# Patient Record
Sex: Female | Born: 1975 | Race: Black or African American | Hispanic: No | Marital: Single | Smoking: Current every day smoker
Health system: Southern US, Community
[De-identification: ages and names within clinical notes are randomized; demographics above are authoritative.]

## PROBLEM LIST (undated history)

## (undated) DIAGNOSIS — D259 Leiomyoma of uterus, unspecified: Secondary | ICD-10-CM

## (undated) DIAGNOSIS — S82843A Displaced bimalleolar fracture of unspecified lower leg, initial encounter for closed fracture: Secondary | ICD-10-CM

## (undated) DIAGNOSIS — F17200 Nicotine dependence, unspecified, uncomplicated: Secondary | ICD-10-CM

## (undated) HISTORY — PX: NO PAST SURGERIES: SHX2092

## (undated) HISTORY — DX: Leiomyoma of uterus, unspecified: D25.9

---

## 1998-07-19 ENCOUNTER — Emergency Department (HOSPITAL_COMMUNITY): Admission: EM | Admit: 1998-07-19 | Discharge: 1998-07-19 | Payer: Self-pay | Admitting: Emergency Medicine

## 1998-09-30 ENCOUNTER — Emergency Department (HOSPITAL_COMMUNITY): Admission: EM | Admit: 1998-09-30 | Discharge: 1998-09-30 | Payer: Self-pay | Admitting: Emergency Medicine

## 1999-03-25 ENCOUNTER — Emergency Department (HOSPITAL_COMMUNITY): Admission: EM | Admit: 1999-03-25 | Discharge: 1999-03-25 | Payer: Self-pay | Admitting: Emergency Medicine

## 1999-06-08 ENCOUNTER — Emergency Department (HOSPITAL_COMMUNITY): Admission: EM | Admit: 1999-06-08 | Discharge: 1999-06-08 | Payer: Self-pay | Admitting: Emergency Medicine

## 2000-04-26 ENCOUNTER — Ambulatory Visit (HOSPITAL_COMMUNITY): Admission: RE | Admit: 2000-04-26 | Discharge: 2000-04-26 | Payer: Self-pay | Admitting: *Deleted

## 2000-05-17 ENCOUNTER — Ambulatory Visit (HOSPITAL_COMMUNITY): Admission: RE | Admit: 2000-05-17 | Discharge: 2000-05-17 | Payer: Self-pay | Admitting: *Deleted

## 2000-06-01 ENCOUNTER — Encounter: Payer: Self-pay | Admitting: *Deleted

## 2000-06-01 ENCOUNTER — Encounter (INDEPENDENT_AMBULATORY_CARE_PROVIDER_SITE_OTHER): Payer: Self-pay | Admitting: Specialist

## 2000-06-01 ENCOUNTER — Inpatient Hospital Stay (HOSPITAL_COMMUNITY): Admission: AD | Admit: 2000-06-01 | Discharge: 2000-06-15 | Payer: Self-pay | Admitting: *Deleted

## 2000-06-07 ENCOUNTER — Encounter: Payer: Self-pay | Admitting: Obstetrics & Gynecology

## 2000-12-13 ENCOUNTER — Emergency Department (HOSPITAL_COMMUNITY): Admission: EM | Admit: 2000-12-13 | Discharge: 2000-12-13 | Payer: Self-pay | Admitting: Emergency Medicine

## 2001-09-26 ENCOUNTER — Inpatient Hospital Stay (HOSPITAL_COMMUNITY): Admission: AD | Admit: 2001-09-26 | Discharge: 2001-09-26 | Payer: Self-pay | Admitting: *Deleted

## 2002-11-25 ENCOUNTER — Emergency Department (HOSPITAL_COMMUNITY): Admission: EM | Admit: 2002-11-25 | Discharge: 2002-11-25 | Payer: Self-pay | Admitting: Emergency Medicine

## 2003-08-23 ENCOUNTER — Emergency Department (HOSPITAL_COMMUNITY): Admission: EM | Admit: 2003-08-23 | Discharge: 2003-08-23 | Payer: Self-pay

## 2004-10-19 ENCOUNTER — Emergency Department (HOSPITAL_COMMUNITY): Admission: EM | Admit: 2004-10-19 | Discharge: 2004-10-19 | Payer: Self-pay | Admitting: Emergency Medicine

## 2005-08-30 ENCOUNTER — Emergency Department (HOSPITAL_COMMUNITY): Admission: EM | Admit: 2005-08-30 | Discharge: 2005-08-30 | Payer: Self-pay | Admitting: Emergency Medicine

## 2005-11-03 ENCOUNTER — Emergency Department (HOSPITAL_COMMUNITY): Admission: EM | Admit: 2005-11-03 | Discharge: 2005-11-03 | Payer: Self-pay | Admitting: Emergency Medicine

## 2005-11-05 ENCOUNTER — Emergency Department (HOSPITAL_COMMUNITY): Admission: EM | Admit: 2005-11-05 | Discharge: 2005-11-05 | Payer: Self-pay | Admitting: Emergency Medicine

## 2005-11-08 ENCOUNTER — Emergency Department (HOSPITAL_COMMUNITY): Admission: EM | Admit: 2005-11-08 | Discharge: 2005-11-08 | Payer: Self-pay | Admitting: Family Medicine

## 2006-12-27 ENCOUNTER — Emergency Department (HOSPITAL_COMMUNITY): Admission: EM | Admit: 2006-12-27 | Discharge: 2006-12-27 | Payer: Self-pay | Admitting: Family Medicine

## 2008-01-29 ENCOUNTER — Emergency Department (HOSPITAL_COMMUNITY): Admission: EM | Admit: 2008-01-29 | Discharge: 2008-01-29 | Payer: Self-pay | Admitting: Emergency Medicine

## 2008-06-12 ENCOUNTER — Emergency Department (HOSPITAL_COMMUNITY): Admission: EM | Admit: 2008-06-12 | Discharge: 2008-06-12 | Payer: Self-pay | Admitting: Emergency Medicine

## 2008-11-17 ENCOUNTER — Emergency Department (HOSPITAL_COMMUNITY): Admission: EM | Admit: 2008-11-17 | Discharge: 2008-11-17 | Payer: Self-pay | Admitting: Emergency Medicine

## 2008-11-20 ENCOUNTER — Emergency Department (HOSPITAL_COMMUNITY): Admission: EM | Admit: 2008-11-20 | Discharge: 2008-11-20 | Payer: Self-pay | Admitting: Family Medicine

## 2009-02-24 ENCOUNTER — Emergency Department (HOSPITAL_COMMUNITY): Admission: EM | Admit: 2009-02-24 | Discharge: 2009-02-24 | Payer: Self-pay | Admitting: Family Medicine

## 2009-07-15 ENCOUNTER — Emergency Department (HOSPITAL_COMMUNITY): Admission: EM | Admit: 2009-07-15 | Discharge: 2009-07-15 | Payer: Self-pay | Admitting: Family Medicine

## 2010-02-17 ENCOUNTER — Emergency Department (HOSPITAL_COMMUNITY): Admission: EM | Admit: 2010-02-17 | Discharge: 2010-02-17 | Payer: Self-pay | Admitting: Emergency Medicine

## 2011-01-26 LAB — CULTURE, ROUTINE-ABSCESS

## 2011-03-05 NOTE — Discharge Summary (Signed)
[facility]  Patient:    Crystal Webster, Crystal Webster                      MRN: [id number] Adm. Date:  [id number] Disc. Date: [id number] Attending:  Michaelle Copas Dictator:   Pricilla Holm, M.D.                           Discharge Summary  CONSULTS:                     None.  PROCEDURES:                   None.  HISTORY OF PRESENT ILLNESS:   This is a 35 year old, gravida 2, now para 2, who presented at 26 and 4 weeks by 24 week ultrasound.  The patient arrived by EMS.  Thought water was breaking instead bright red blood came out.  The patient denied abdominal pain or dysuria at that time.  Her OB history is significant for a term gestation C-section with last pregnancy no complications.  The patient was in fact neotyrosine positive with pooling of blood in fluid and admitted to [facility] teaching service for ______. Antibiotics were started and betamethasone was given.  An ultrasound was ordered for evaluation.  HOSPITAL COURSE:              The patient had a lengthy course during this particular hospitalization.  She continued to have leakage of fluid.  The baby remained stable.  During this time, the patient was started on Unasyn and continued to remain afebrile and do well.  The patient continued to remain stable and was not orthostatic.  The patient was continued on Unasyn and monitored b.i.d. with expectant management.  On June 13, 2000, the patient was found to have abdominal cramping and had a temperature of 100.5.  Gentamicin and Clindamycin were also added.  The patient had ultrasound for position.  Next, pelvic exam revealed the patient was complete and had a desire to push. Bulging bag was noted and was ruptured.  The patient and placenta delivered precipitously.  Cord was cut and clamped.  As the infant was only 31 weeks and 3 days, he was intubated and taken to the NICU.  Mom remained stable and was transferred to  mother-baby.  The following day, the mother was doing well with minimal lochia and planned to bottle feed and requested Depo-Provera for birth control on discharge.  The mother was discharged to home on the 29th with instructions and prescriptions.  CONDITION ON DISCHARGE:       Good.  DISPOSITION:                  Discharge to home.  DISCHARGE MEDICATIONS:        Percocet for pain.  DISCHARGE INSTRUCTIONS:       The patient was given instructions on activity, diet, and symptoms to warrant further treatment.  FOLLOW-UP:                    The patient will follow up for routine six-week postpartum visit with her primary doctor. DD:  08/01/00 TD:  08/01/00 Job: [id number]

## 2011-04-24 ENCOUNTER — Inpatient Hospital Stay (INDEPENDENT_AMBULATORY_CARE_PROVIDER_SITE_OTHER)
Admission: RE | Admit: 2011-04-24 | Discharge: 2011-04-24 | Disposition: A | Discharge status: Home / Self Care | Payer: Self-pay | Source: Ambulatory Visit | Attending: Emergency Medicine | Admitting: Emergency Medicine

## 2011-04-24 DIAGNOSIS — R197 Diarrhea, unspecified: Secondary | ICD-10-CM

## 2011-05-26 ENCOUNTER — Inpatient Hospital Stay (INDEPENDENT_AMBULATORY_CARE_PROVIDER_SITE_OTHER)
Admission: RE | Admit: 2011-05-26 | Discharge: 2011-05-26 | Disposition: A | Discharge status: Home / Self Care | Payer: Self-pay | Source: Ambulatory Visit | Attending: Emergency Medicine | Admitting: Emergency Medicine

## 2011-05-26 DIAGNOSIS — IMO0002 Reserved for concepts with insufficient information to code with codable children: Secondary | ICD-10-CM

## 2012-01-30 ENCOUNTER — Emergency Department (INDEPENDENT_AMBULATORY_CARE_PROVIDER_SITE_OTHER)
Admission: EM | Admit: 2012-01-30 | Discharge: 2012-01-30 | Disposition: A | Discharge status: Home / Self Care | Payer: Self-pay | Source: Home / Self Care

## 2012-01-30 ENCOUNTER — Encounter (HOSPITAL_COMMUNITY): Payer: Self-pay

## 2012-01-30 DIAGNOSIS — L723 Sebaceous cyst: Secondary | ICD-10-CM

## 2012-01-30 DIAGNOSIS — L720 Epidermal cyst: Secondary | ICD-10-CM

## 2012-01-30 NOTE — ED Provider Notes (Signed)
History     CSN: [id number]  Arrival date & time 01/30/12  1310   None     Chief Complaint  Patient presents with  . Eye Problem    left eye pain for 1 month.  Noted 2 bumps over left eye.  Pt. states they having been hurting for 1 month.  No drainage noted.     (Consider location/radiation/quality/duration/timing/severity/associated sxs/prior treatment) HPI Comments: Patient presents today with complaints of of 2 bumps her left eyebrow for one month. She states that they're tender to the touch and has noticed recently that there increasing in size. No redness or drainage. She has not sought medical evaluation of these previously.   History reviewed. No pertinent past medical history.  History reviewed. No pertinent past surgical history.  History reviewed. No pertinent family history.  History  Substance Use Topics  . Smoking status: Current Everyday Smoker -- 1.0 packs/day    Types: Cigarettes  . Smokeless tobacco: Not on file  . Alcohol Use: No    OB History    Grav Para Term Preterm Abortions TAB SAB Ect Mult Living                  Review of Systems  Constitutional: Negative for fever and chills.  HENT: Negative for ear pain, congestion, sore throat and rhinorrhea.   Eyes: Negative for pain.  Respiratory: Negative for cough.     Allergies  Review of patient's allergies indicates no known allergies.  Home Medications  No current outpatient prescriptions on file.  BP 148/98  Pulse 89  Temp(Src) 99.2 F (37.3 C) (Oral)  Resp 16  SpO2 100%  LMP 12/31/2011  Physical Exam  Nursing note and vitals reviewed. Constitutional: She appears well-developed and well-nourished. No distress.  HENT:  Head: Normocephalic and atraumatic.  Right Ear: Tympanic membrane, external ear and ear canal normal.  Left Ear: Tympanic membrane, external ear and ear canal normal.  Nose: Nose normal.  Mouth/Throat: Uvula is midline, oropharynx is clear and moist and mucous  membranes are normal. No oropharyngeal exudate, posterior oropharyngeal edema or posterior oropharyngeal erythema.  Neck: Neck supple.  Cardiovascular: Normal rate, regular rhythm and normal heart sounds.   Pulmonary/Chest: Effort normal and breath sounds normal. No respiratory distress.  Lymphadenopathy:    She has no cervical adenopathy.  Neurological: She is alert.  Skin: Skin is warm and dry.       1 cm epidermal cyst, mobile, inferior to medial left eyebrow, with central white comedome. No erythema.  Psychiatric: She has a normal mood and affect.    ED Course  Procedures (including critical care time)  Labs Reviewed - No data to display No results found.   1. Epidermal cyst       MDM          Melody Comas, PA 01/30/12 1644

## 2012-01-30 NOTE — Discharge Instructions (Signed)
The cyst that you have near your eyebrow is not infected and does not need antibiotics.  If this is bothersome to you, and you would like to have it removed or drained, you will need to see a dermatologist. I recommend [facility], and have provided you with their contact information.

## 2012-01-30 NOTE — ED Notes (Signed)
left eye pain for 1 month.  Noted 2 bumps over left eye.  Pt. states they having been hurting for 1 month.  No drainage noted.

## 2012-02-01 NOTE — ED Provider Notes (Signed)
Medical screening examination/treatment/procedure(s) were performed by non-physician practitioner and as supervising physician I was immediately available for consultation/collaboration.  Luiz Blare MD   Luiz Blare, MD 02/01/12 [phone]

## 2012-02-23 ENCOUNTER — Telehealth (HOSPITAL_COMMUNITY): Payer: Self-pay | Admitting: *Deleted

## 2012-02-23 NOTE — ED Notes (Signed)
Pt. called on VM 5/6 no- message. 5/8 I called pt. back and she said she needs another referral.  She called the Dermatology office we referred her to on [address] but they can't see her until Oct.  I told her to try Dr. Terri Piedra on [facility]. and gave her the number.  If they are booked out she can try other dermatology offices in the phone book, because we don't have an on call schedule for Dermatology. Vassie Moselle 02/23/2012

## 2012-06-21 ENCOUNTER — Emergency Department (HOSPITAL_COMMUNITY)
Admission: EM | Admit: 2012-06-21 | Discharge: 2012-06-22 | Disposition: A | Discharge status: Home / Self Care | Payer: Self-pay | Attending: Emergency Medicine | Admitting: Emergency Medicine

## 2012-06-21 ENCOUNTER — Encounter (HOSPITAL_COMMUNITY): Payer: Self-pay | Admitting: Emergency Medicine

## 2012-06-21 DIAGNOSIS — R220 Localized swelling, mass and lump, head: Secondary | ICD-10-CM | POA: Insufficient documentation

## 2012-06-21 NOTE — ED Notes (Signed)
No answer x1

## 2012-06-21 NOTE — ED Notes (Signed)
Pt has area that is swollen below left eyebrow. Area is hard but not draining.

## 2012-11-18 ENCOUNTER — Emergency Department (HOSPITAL_COMMUNITY)
Admission: EM | Admit: 2012-11-18 | Discharge: 2012-11-18 | Disposition: A | Discharge status: Home / Self Care | Payer: Self-pay | Attending: Emergency Medicine | Admitting: Emergency Medicine

## 2012-11-18 ENCOUNTER — Encounter (HOSPITAL_COMMUNITY): Payer: Self-pay | Admitting: Emergency Medicine

## 2012-11-18 DIAGNOSIS — F17200 Nicotine dependence, unspecified, uncomplicated: Secondary | ICD-10-CM | POA: Insufficient documentation

## 2012-11-18 DIAGNOSIS — K047 Periapical abscess without sinus: Secondary | ICD-10-CM | POA: Insufficient documentation

## 2012-11-18 MED ORDER — ONDANSETRON 4 MG PO TBDP
4.0000 mg | ORAL_TABLET | Freq: Once | ORAL | Status: AC
  Administered 2012-11-18: 4 mg via ORAL
  Filled 2012-11-18: qty 1

## 2012-11-18 MED ORDER — PENICILLIN V POTASSIUM 250 MG PO TABS
500.0000 mg | ORAL_TABLET | Freq: Once | ORAL | Status: AC
  Administered 2012-11-18: 500 mg via ORAL
  Filled 2012-11-18: qty 2

## 2012-11-18 MED ORDER — OXYCODONE-ACETAMINOPHEN 5-325 MG PO TABS: ORAL_TABLET | ORAL | Status: DC

## 2012-11-18 MED ORDER — METRONIDAZOLE 500 MG PO TABS
500.0000 mg | ORAL_TABLET | Freq: Once | ORAL | Status: AC
  Administered 2012-11-18: 500 mg via ORAL
  Filled 2012-11-18: qty 1

## 2012-11-18 MED ORDER — METRONIDAZOLE 500 MG PO TABS: 500.0000 mg | ORAL_TABLET | Freq: Three times a day (TID) | ORAL | Status: DC

## 2012-11-18 MED ORDER — MORPHINE SULFATE 4 MG/ML IJ SOLN
4.0000 mg | Freq: Once | INTRAMUSCULAR | Status: AC
  Administered 2012-11-18: 4 mg via INTRAMUSCULAR
  Filled 2012-11-18: qty 1

## 2012-11-18 MED ORDER — AMOXICILLIN 500 MG PO CAPS: 500.0000 mg | ORAL_CAPSULE | Freq: Three times a day (TID) | ORAL | Status: DC

## 2012-11-18 MED ORDER — OXYCODONE-ACETAMINOPHEN 5-325 MG PO TABS
1.0000 | ORAL_TABLET | Freq: Once | ORAL | Status: AC
  Administered 2012-11-18: 1 via ORAL
  Filled 2012-11-18: qty 1

## 2012-11-18 MED ORDER — MORPHINE SULFATE 4 MG/ML IJ SOLN: 4.0000 mg | INTRAMUSCULAR | Status: DC | PRN

## 2012-11-18 MED ORDER — ONDANSETRON HCL 4 MG/2ML IJ SOLN: 4.0000 mg | Freq: Once | INTRAMUSCULAR | Status: DC

## 2012-11-18 NOTE — ED Notes (Signed)
Med orders on hold until PA talks to MD.

## 2012-11-18 NOTE — ED Provider Notes (Signed)
History     CSN: [id number]  Arrival date & time 11/18/12  1145   First MD Initiated Contact with Patient 11/18/12 1157      Chief Complaint  Patient presents with  . Dental Problem    (Consider location/radiation/quality/duration/timing/severity/associated sxs/prior treatment) HPI  Crystal Webster is a 37 y.o. female complaining of left lower tooth pain over the course of the month. Patient's cheek started swelling yesterday. Patient reports a subjective fever started last night. Nausea with no vomiting. Denies any difficulty breathing or change in vision.   History reviewed. No pertinent past medical history.  History reviewed. No pertinent past surgical history.  No family history on file.  History  Substance Use Topics  . Smoking status: Current Every Day Smoker -- 1.0 packs/day    Types: Cigarettes  . Smokeless tobacco: Not on file  . Alcohol Use: No    OB History    Grav Para Term Preterm Abortions TAB SAB Ect Mult Living                  Review of Systems  Constitutional: Negative for fever.  Respiratory: Negative for shortness of breath.   Cardiovascular: Negative for chest pain.  Gastrointestinal: Negative for nausea, vomiting, abdominal pain and diarrhea.  All other systems reviewed and are negative.    Allergies  Review of patient's allergies indicates no known allergies.  Home Medications  No current outpatient prescriptions on file.  BP 141/93  Pulse 99  Temp 99.6 F (37.6 C) (Oral)  Resp 18  SpO2 100%  LMP 10/02/2012  Physical Exam  Nursing note and vitals reviewed. Constitutional: She is oriented to person, place, and time. She appears well-developed and well-nourished. No distress.  HENT:  Head: Normocephalic.    Mouth/Throat: Oropharynx is clear and moist.  Eyes: Conjunctivae normal and EOM are normal. Pupils are equal, round, and reactive to light.  Cardiovascular: Normal rate.   Pulmonary/Chest: Effort normal. No stridor.    Musculoskeletal: Normal range of motion.  Neurological: She is alert and oriented to person, place, and time.  Psychiatric: She has a normal mood and affect.    ED Course  Procedures (including critical care time)  Labs Reviewed - No data to display No results found.   1. Dental abscess       MDM   OMSF consult From Dr. Chales Salmon appreciated : He has asked [place] to start the patient penicillin Flagyl and for her to follow up in the office on Monday for an I&D.   Pt verbalized understanding and agrees with care plan. Outpatient follow-up and return precautions given.    New Prescriptions   AMOXICILLIN (AMOXIL) 500 MG CAPSULE    Take 1 capsule (500 mg total) by mouth 3 (three) times daily.   METRONIDAZOLE (FLAGYL) 500 MG TABLET    Take 1 tablet (500 mg total) by mouth 3 (three) times daily. One po bid x 7 days   OXYCODONE-ACETAMINOPHEN (PERCOCET/ROXICET) 5-325 MG PER TABLET    1 to 2 tabs PO q6hrs  PRN for pain    Wynetta Emery, PA-C 11/18/12 2228

## 2012-11-18 NOTE — ED Notes (Signed)
Pt instructed to get abx started today and to call Dr. Lanae Boast office first thing on Monday am. Also instructed to return the ED if swelling worsens.

## 2012-11-18 NOTE — ED Notes (Signed)
Right lower jaw swollen and painful x 2 days.

## 2012-11-18 NOTE — ED Notes (Signed)
Pt. Stated, I've had mouth problem for pain in my mouth

## 2012-11-19 NOTE — ED Provider Notes (Signed)
Medical screening examination/treatment/procedure(s) were conducted as a shared visit with non-physician practitioner(s) and myself.  I personally evaluated the patient during the encounter Pt with right lower tooth pain for few days. Now w swelling to area. No swelling to floor of mouth, throat or neck. Dental abscess. No difficulty breathing/swallowing. Will consult oral surgery.   Suzi Roots, MD 11/19/12 [phone]

## 2013-02-16 ENCOUNTER — Encounter (HOSPITAL_COMMUNITY): Payer: Self-pay | Admitting: Emergency Medicine

## 2013-02-16 ENCOUNTER — Emergency Department (HOSPITAL_COMMUNITY)
Admission: EM | Admit: 2013-02-16 | Discharge: 2013-02-16 | Disposition: A | Discharge status: Home / Self Care | Payer: Self-pay | Attending: Emergency Medicine | Admitting: Emergency Medicine

## 2013-02-16 DIAGNOSIS — R21 Rash and other nonspecific skin eruption: Secondary | ICD-10-CM | POA: Insufficient documentation

## 2013-02-16 DIAGNOSIS — F17200 Nicotine dependence, unspecified, uncomplicated: Secondary | ICD-10-CM | POA: Insufficient documentation

## 2013-02-16 DIAGNOSIS — M7989 Other specified soft tissue disorders: Secondary | ICD-10-CM

## 2013-02-16 DIAGNOSIS — R229 Localized swelling, mass and lump, unspecified: Secondary | ICD-10-CM | POA: Insufficient documentation

## 2013-02-16 DIAGNOSIS — H538 Other visual disturbances: Secondary | ICD-10-CM | POA: Insufficient documentation

## 2013-02-16 MED ORDER — SULFAMETHOXAZOLE-TRIMETHOPRIM 800-160 MG PO TABS: 1.0000 | ORAL_TABLET | Freq: Two times a day (BID) | ORAL | Status: DC

## 2013-02-16 MED ORDER — FLUORESCEIN SODIUM 1 MG OP STRP
1.0000 | ORAL_STRIP | Freq: Once | OPHTHALMIC | Status: AC
  Administered 2013-02-16: 1 via OPHTHALMIC
  Filled 2013-02-16: qty 1

## 2013-02-16 MED ORDER — TETRACAINE HCL 0.5 % OP SOLN
2.0000 [drp] | Freq: Once | OPHTHALMIC | Status: AC
  Administered 2013-02-16: 2 [drp] via OPHTHALMIC
  Filled 2013-02-16: qty 2

## 2013-02-16 MED ORDER — CEPHALEXIN 500 MG PO CAPS: 500.0000 mg | ORAL_CAPSULE | Freq: Four times a day (QID) | ORAL | Status: DC

## 2013-02-16 NOTE — ED Provider Notes (Signed)
Medical screening examination/treatment/procedure(s) were performed by non-physician practitioner and as supervising physician I was immediately available for consultation/collaboration.  Flint Melter, MD 02/16/13 [phone]

## 2013-02-16 NOTE — ED Notes (Signed)
Pt reports painful, swollen area above left eye for the last year. Pt reports just recently area has become increasingly painful. Pt states she has blurred vision occasionally. None at present. Alert, oriented x4.

## 2013-02-16 NOTE — ED Provider Notes (Signed)
History     CSN: [id number]  Arrival date & time 02/16/13  0906   First MD Initiated Contact with Patient 02/16/13 [phone]      Chief Complaint  Patient presents with  . Eye Pain    (Consider location/radiation/quality/duration/timing/severity/associated sxs/prior treatment) HPI  Crystal Webster is a 37 y.o. female complaining of swollen area just inferior to the left eyebrow. This area has been swollen for approximately one year however there are 2 new lesions in the pain is increasing over the past week. Patient states she has a blurred vision. She denies trauma to the area, fever, nausea vomiting, conjunctival injection, discharge.   History reviewed. No pertinent past medical history.  History reviewed. No pertinent past surgical history.  History reviewed. No pertinent family history.  History  Substance Use Topics  . Smoking status: Current Every Day Smoker -- 1.00 packs/day    Types: Cigarettes  . Smokeless tobacco: Not on file  . Alcohol Use: No    OB History   Grav Para Term Preterm Abortions TAB SAB Ect Mult Living                  Review of Systems  Constitutional: Negative for fever.  Eyes: Positive for visual disturbance. Negative for pain, discharge and redness.  Respiratory: Negative for shortness of breath.   Cardiovascular: Negative for chest pain.  Gastrointestinal: Negative for nausea, vomiting, abdominal pain and diarrhea.  Skin: Positive for rash.  All other systems reviewed and are negative.    Allergies  Review of patient's allergies indicates no known allergies.  Home Medications  No current outpatient prescriptions on file.  BP 116/81  Pulse 86  Temp(Src) 97.9 F (36.6 C) (Oral)  Resp 18  SpO2 100%  LMP 02/13/2013  Physical Exam  Nursing note and vitals reviewed. Constitutional: She is oriented to person, place, and time. She appears well-developed and well-nourished. No distress.  HENT:  Head: Normocephalic.    Mouth/Throat:  Oropharynx is clear and moist.  Eyes: Conjunctivae, EOM and lids are normal. Pupils are equal, round, and reactive to light. Right eye exhibits no chemosis, no discharge, no exudate and no hordeolum. No foreign body present in the right eye. Left eye exhibits no chemosis, no discharge, no exudate and no hordeolum. No foreign body present in the left eye. No scleral icterus.  Patient has full extraocular movement with no pain or diplopia.  Cardiovascular: Normal rate, regular rhythm and intact distal pulses.   Pulmonary/Chest: Effort normal and breath sounds normal. No stridor.  Abdominal: Soft.  Musculoskeletal: Normal range of motion.  Lymphadenopathy:    She has no cervical adenopathy.  Neurological: She is alert and oriented to person, place, and time.  Psychiatric: She has a normal mood and affect.    ED Course  Procedures (including critical care time)  Slit lamp exam shows normal anterior chamber, there is no abnormal fluorescein uptake.   Labs Reviewed - No data to display No results found.    Visual Acuity        02/16/13 09:35:05                    Visual Acuity    Bilateral Near       Bilateral Distance  20/20     R Near       R Distance  20/30     L Near       L Distance  20/30  1. Nodule of soft tissue       MDM   Crystal Webster is a 37 y.o. female with soft tissue swelling just inferior to the left eyebrow. Patient has normal visual acuity, lamp exam shows a normal anterior chamber with no abnormal fluorescein uptake.   Doubt periorbital cellulitis. This is a superficial soft tissue infection/abscess. I will start her on Bactrim and Keflex, encourage warm compresses. Return precautions are given.   Filed Vitals:   02/16/13 0914 02/16/13 1021  BP: 116/81 123/88  Pulse: 86 87  Temp: 97.9 F (36.6 C)   TempSrc: Oral   Resp: 18 18  SpO2: 100% 100%     Pt verbalized understanding and agrees with care plan. Outpatient follow-up and  return precautions given.    Discharge Medication List as of 02/16/2013 10:12 AM    START taking these medications   Details  cephALEXin (KEFLEX) 500 MG capsule Take 1 capsule (500 mg total) by mouth 4 (four) times daily., Starting 02/16/2013, Until Discontinued, Print    sulfamethoxazole-trimethoprim (SEPTRA DS) 800-160 MG per tablet Take 1 tablet by mouth 2 (two) times daily., Starting 02/16/2013, Until Discontinued, [employer], PA-C 02/16/13 1537

## 2013-02-24 ENCOUNTER — Encounter (HOSPITAL_COMMUNITY): Payer: Self-pay | Admitting: Nurse Practitioner

## 2013-02-24 ENCOUNTER — Emergency Department (HOSPITAL_COMMUNITY)
Admission: EM | Admit: 2013-02-24 | Discharge: 2013-02-24 | Disposition: A | Discharge status: Home / Self Care | Payer: Self-pay | Attending: Emergency Medicine | Admitting: Emergency Medicine

## 2013-02-24 DIAGNOSIS — R220 Localized swelling, mass and lump, head: Secondary | ICD-10-CM | POA: Insufficient documentation

## 2013-02-24 DIAGNOSIS — L0201 Cutaneous abscess of face: Secondary | ICD-10-CM | POA: Insufficient documentation

## 2013-02-24 DIAGNOSIS — L0291 Cutaneous abscess, unspecified: Secondary | ICD-10-CM

## 2013-02-24 DIAGNOSIS — F17200 Nicotine dependence, unspecified, uncomplicated: Secondary | ICD-10-CM | POA: Insufficient documentation

## 2013-02-24 DIAGNOSIS — H01009 Unspecified blepharitis unspecified eye, unspecified eyelid: Secondary | ICD-10-CM | POA: Insufficient documentation

## 2013-02-24 DIAGNOSIS — L03211 Cellulitis of face: Secondary | ICD-10-CM | POA: Insufficient documentation

## 2013-02-24 MED ORDER — CLINDAMYCIN PHOSPHATE 600 MG/50ML IV SOLN: 600.0000 mg | Freq: Once | INTRAVENOUS | Status: DC

## 2013-02-24 MED ORDER — SODIUM CHLORIDE 0.9 % IV BOLUS (SEPSIS): 500.0000 mL | Freq: Once | INTRAVENOUS | Status: DC

## 2013-02-24 NOTE — ED Provider Notes (Signed)
History     CSN: [id number]  Arrival date & time 02/24/13  1138   First MD Initiated Contact with Patient 02/24/13 1144      Chief Complaint  Patient presents with  . Eye Pain    (Consider location/radiation/quality/duration/timing/severity/associated sxs/prior treatment) HPI Comments: Patient is a 37 year old female who presents for redness and swelling to her left eyebrow and eyelid. Patient states that she was here on 02/16/2013 for evaluation and was discharged with Bactrim and Keflex for tx of abscess. Patient states that swelling has worsened since this time; she denies relief of symptoms with antibiotics. Patient admits to associated constant, nonradiating, throbbing, burning pain to the area. Patient denies fevers, visual disturbances, ear pain or discharge, headaches, neck pain or stiffness, sore throat or difficulty swallowing.  The history is provided by the patient. No language interpreter was used.    History reviewed. No pertinent past medical history.  History reviewed. No pertinent past surgical history.  History reviewed. No pertinent family history.  History  Substance Use Topics  . Smoking status: Current Every Day Smoker -- 1.00 packs/day    Types: Cigarettes  . Smokeless tobacco: Not on file  . Alcohol Use: No    OB History   Grav Para Term Preterm Abortions TAB SAB Ect Mult Living                  Review of Systems  Constitutional: Negative for fever.  HENT: Positive for facial swelling (+swelling to L eyebrow and eyelid). Negative for ear pain, sore throat, trouble swallowing, neck pain, neck stiffness and ear discharge.   Eyes: Positive for pain. Negative for discharge, redness and visual disturbance.  Gastrointestinal: Negative for nausea and vomiting.  Skin: Positive for color change (erythema to L eyebrow and eyelid).  All other systems reviewed and are negative.    Allergies  Review of patient's allergies indicates no known  allergies.  Home Medications   No current outpatient prescriptions on file.  BP 136/88  Pulse 65  Temp(Src) 98.3 F (36.8 C) (Oral)  Resp 18  SpO2 100%  LMP 02/13/2013  Physical Exam  Nursing note and vitals reviewed. Constitutional: She is oriented to person, place, and time. She appears well-developed and well-nourished. No distress.  HENT:  Head: Normocephalic and atraumatic.  Right Ear: External ear normal.  Left Ear: External ear normal.  Mouth/Throat: Oropharynx is clear and moist. No oropharyngeal exudate.  Eyes: Conjunctivae and EOM are normal. Pupils are equal, round, and reactive to light. Right eye exhibits no discharge. Left eye exhibits no discharge. No scleral icterus.  EOMs intact; +L upper lid blepharitis.  Neck: Normal range of motion. Neck supple.  Cardiovascular: Normal rate, regular rhythm, normal heart sounds and intact distal pulses.   Pulmonary/Chest: Effort normal and breath sounds normal. No respiratory distress. She has no wheezes. She has no rales.  Musculoskeletal: Normal range of motion.  Lymphadenopathy:    She has no cervical adenopathy.  Neurological: She is alert and oriented to person, place, and time.  Skin: Skin is warm and dry. No rash noted. She is not diaphoretic. No pallor.  + 0.5x1.5cm abscess of L eyebrow. Area indurated with blanching erythema; no distinct area of fluctuance appreciate. +L upper lid blepharitis. No active drainage, skin dryness, or scaling.  Psychiatric: She has a normal mood and affect. Her behavior is normal.    ED Course  Procedures (including critical care time)  Labs Reviewed - No data to display No results found.  INCISION AND DRAINAGE Performed by: Antony Madura Consent: Verbal consent obtained. Risks and benefits: risks, benefits and alternatives were discussed Type: abscess  Body area: L inferior eyebrow  Anesthesia: local infiltration  Incision was made with a scalpel.  Local anesthetic:  lidocaine 2% without epinephrine  Anesthetic total: 1.5 ml  Complexity: complex Blunt dissection to break up loculations  Drainage: purulent and blood  Drainage amount: moderate  Packing material: none  Patient tolerance: Patient tolerated the procedure well with no immediate complications.   1. Abscess     MDM  Patient presents for abscess to L eyebrow and eyelid x 8 days that has been gradually worsening despite tx with PO Bactrim and Keflex. Patient's VSS and she is afebrile; EOMs and visual fields intact. Abscess I&D'd in ED today with purulent drainage; patient tolerated well. Instructed to finish course of antibiotics and to follow up with PCP, an UC, or an ED in 48-72 hours for recheck. Indications for ED return provided. Patient states comfort and understanding with this d/c plan without any unaddressed concerns. Patient seen also by Dr. Denton Lank who is in agreement with this work up and management plan.        Antony Madura, PA-C 02/24/13 1329

## 2013-02-24 NOTE — ED Notes (Signed)
Pt with painful abscess to L eyelid onset last week, was seen here for same and started on oral cephalexin and bactrim which she has been taking but pain and swelling continue to increase.

## 2013-02-27 NOTE — ED Provider Notes (Signed)
Medical screening examination/treatment/procedure(s) were conducted as a shared visit with non-physician practitioner(s) and myself.  I personally evaluated the patient during the encounter Pt with abscess in area left eyebrow, previous tx w only abx. No fever. No eye involvement, redness, or pain w eom. No periorbital or orb cellulitis. I and D.   Suzi Roots, MD 02/27/13 1101

## 2013-12-23 ENCOUNTER — Emergency Department (HOSPITAL_COMMUNITY)
Admission: EM | Admit: 2013-12-23 | Discharge: 2013-12-23 | Disposition: A | Discharge status: Home / Self Care | Payer: Self-pay | Attending: Emergency Medicine | Admitting: Emergency Medicine

## 2013-12-23 ENCOUNTER — Encounter (HOSPITAL_COMMUNITY): Payer: Self-pay | Admitting: Emergency Medicine

## 2013-12-23 DIAGNOSIS — F17200 Nicotine dependence, unspecified, uncomplicated: Secondary | ICD-10-CM | POA: Insufficient documentation

## 2013-12-23 DIAGNOSIS — L03211 Cellulitis of face: Principal | ICD-10-CM | POA: Insufficient documentation

## 2013-12-23 DIAGNOSIS — L0201 Cutaneous abscess of face: Secondary | ICD-10-CM | POA: Insufficient documentation

## 2013-12-23 DIAGNOSIS — L0291 Cutaneous abscess, unspecified: Secondary | ICD-10-CM

## 2013-12-23 NOTE — ED Provider Notes (Signed)
CSN: [id number]     Arrival date & time 12/23/13  1246 History  This chart was scribed for Emilia Beck, PA working with Junius Argyle, MD by Quintella Reichert, ED Scribe. This patient was seen in room TR04C/TR04C and the patient's care was started at 1:24 PM.   Chief Complaint  Patient presents with  . Eye Problem    The history is provided by the patient. No language interpreter was used.    HPI Comments: Crystal Webster is a 38 y.o. female who presents to the Emergency Department complaining of a gradually-worsening bump on her left upper eyelid that she first noticed yesterday morning.  Pt reports sharp aching 9/10 pain to the area.  She denies associated visual changes.   History reviewed. No pertinent past medical history.  History reviewed. No pertinent past surgical history.  History reviewed. No pertinent family history.   History  Substance Use Topics  . Smoking status: Current Every Day Smoker -- 1.00 packs/day    Types: Cigarettes  . Smokeless tobacco: Not on file  . Alcohol Use: No    OB History   Grav Para Term Preterm Abortions TAB SAB Ect Mult Living                   Review of Systems  Eyes: Negative for visual disturbance.       Bump on left upper eyelid  All other systems reviewed and are negative.      Allergies  Review of patient's allergies indicates no known allergies.  Home Medications  No current outpatient prescriptions on file.  BP 140/76  Pulse 88  Temp(Src) 98.2 F (36.8 C) (Oral)  Resp 20  Wt 161 lb (73.029 kg)  SpO2 100%  LMP 12/03/2013  Physical Exam  Nursing note and vitals reviewed. Constitutional: She is oriented to person, place, and time. She appears well-developed and well-nourished. No distress.  HENT:  Head: Normocephalic and atraumatic.  Dime-sized area of fluctuance and tenderness of left eyebrow.  No open wound.  Eyes: EOM are normal.  Neck: Neck supple. No tracheal deviation present.   Cardiovascular: Normal rate.   Pulmonary/Chest: Effort normal. No respiratory distress.  Musculoskeletal: Normal range of motion.  Neurological: She is alert and oriented to person, place, and time.  Skin: Skin is warm and dry.  Psychiatric: She has a normal mood and affect. Her behavior is normal.    ED Course  Procedures (including critical care time)  DIAGNOSTIC STUDIES: Oxygen Saturation is 100% on room air, normal by my interpretation.    COORDINATION OF CARE: 1:26 PM-Discussed treatment plan which includes I&D and antibiotics with pt at bedside and pt agreed to plan.   INCISION AND DRAINAGE PROCEDURE NOTE: Patient identification was confirmed and verbal consent was obtained. This procedure was performed by Emilia Beck, PA at 1:27 PM. Site: Left eyebrow Sterile procedures observed Needle size: 18 gauge Drainage: 1 ml Complexity: Simple Incision made over site, wound drained and explored loculations, rinsed with copious amounts of normal saline, wound covered with dry, sterile dressing.  Pt tolerated procedure well  without complications.  Instructions for care discussed verbally and pt provided with additional written instructions for homecare and f/u.   Labs Review Labs Reviewed - No data to display  Imaging Review No results found.   EKG Interpretation None      MDM   Final diagnoses:  Abscess    1:37 PM Abscess drained without complication. Patient will be discharged with instructions  to return with worsening or concerning symptoms.   I personally performed the services described in this documentation, which was scribed in my presence. The recorded information has been reviewed and is accurate.    Emilia BeckKaitlyn Azaylah Stailey, PA-C 12/23/13 1337

## 2013-12-23 NOTE — Discharge Instructions (Signed)
Keep wound area clean. Refer to attached documents for more information. Return to the ED with worsening or concerning symptoms.  °

## 2013-12-23 NOTE — ED Notes (Signed)
Patient reports painful "bump" on the left eye. Patient complains of pain when looking up.

## 2013-12-23 NOTE — ED Provider Notes (Signed)
Medical screening examination/treatment/procedure(s) were performed by non-physician practitioner and as supervising physician I was immediately available for consultation/collaboration.   EKG Interpretation None        Junius ArgyleForrest S Blaike Newburn, MD 12/23/13 1531

## 2013-12-23 NOTE — ED Notes (Signed)
Pt reports bump to left upper eyelid that started yesterday morning. Having pain but denies any vision changes.

## 2014-08-30 DIAGNOSIS — S82843A Displaced bimalleolar fracture of unspecified lower leg, initial encounter for closed fracture: Secondary | ICD-10-CM

## 2014-08-30 HISTORY — DX: Displaced bimalleolar fracture of unspecified lower leg, initial encounter for closed fracture: S82.843A

## 2014-09-01 ENCOUNTER — Emergency Department (HOSPITAL_COMMUNITY)
Admission: EM | Admit: 2014-09-01 | Discharge: 2014-09-01 | Disposition: A | Discharge status: Home / Self Care | Payer: Self-pay | Attending: Emergency Medicine | Admitting: Emergency Medicine

## 2014-09-01 ENCOUNTER — Encounter (HOSPITAL_COMMUNITY): Payer: Self-pay | Admitting: Emergency Medicine

## 2014-09-01 ENCOUNTER — Emergency Department (HOSPITAL_COMMUNITY): Payer: Self-pay

## 2014-09-01 DIAGNOSIS — Y9389 Activity, other specified: Secondary | ICD-10-CM | POA: Insufficient documentation

## 2014-09-01 DIAGNOSIS — W010XXA Fall on same level from slipping, tripping and stumbling without subsequent striking against object, initial encounter: Secondary | ICD-10-CM

## 2014-09-01 DIAGNOSIS — Y998 Other external cause status: Secondary | ICD-10-CM | POA: Insufficient documentation

## 2014-09-01 DIAGNOSIS — S82841A Displaced bimalleolar fracture of right lower leg, initial encounter for closed fracture: Secondary | ICD-10-CM | POA: Insufficient documentation

## 2014-09-01 DIAGNOSIS — Z720 Tobacco use: Secondary | ICD-10-CM | POA: Insufficient documentation

## 2014-09-01 DIAGNOSIS — W1789XA Other fall from one level to another, initial encounter: Secondary | ICD-10-CM | POA: Insufficient documentation

## 2014-09-01 DIAGNOSIS — T1490XA Injury, unspecified, initial encounter: Secondary | ICD-10-CM

## 2014-09-01 DIAGNOSIS — Y9289 Other specified places as the place of occurrence of the external cause: Secondary | ICD-10-CM | POA: Insufficient documentation

## 2014-09-01 MED ORDER — HYDROMORPHONE HCL 1 MG/ML IJ SOLN
1.0000 mg | Freq: Once | INTRAMUSCULAR | Status: AC
  Administered 2014-09-01: 1 mg via INTRAVENOUS
  Filled 2014-09-01: qty 1

## 2014-09-01 MED ORDER — MORPHINE SULFATE 4 MG/ML IJ SOLN
4.0000 mg | Freq: Once | INTRAMUSCULAR | Status: DC
  Filled 2014-09-01: qty 1

## 2014-09-01 MED ORDER — OXYCODONE-ACETAMINOPHEN 5-325 MG PO TABS: 1.0000 | ORAL_TABLET | Freq: Four times a day (QID) | ORAL | Status: DC | PRN

## 2014-09-01 NOTE — ED Notes (Signed)
Pt from home via [facility] c/o right ankle pain, swelling, and redness from falling down 10 stairs last pm. Pt not on blood thinners and did not hit head and no LOC. She denies pain elsewhere. 150 mcg of fentanyl  Via 20 Left AC.

## 2014-09-01 NOTE — ED Provider Notes (Signed)
CSN: [id number]     Arrival date & time 09/01/14  [id number] History   First MD Initiated Contact with Patient 09/01/14 1858     Chief Complaint  Patient presents with  . Fall  . Ankle Injury     (Consider location/radiation/quality/duration/timing/severity/associated sxs/prior Treatment) HPI Comments: The patient is a 38 y/o female who presents to the Emergency Department complaining of right ankle pain. She states that yesterday she fell down a flight of approximately 10 steps and landed on her right ankle. She notes increasing pain and swelling since yesterday. Pain currently rated 9/10, this is after she received fentanyl via EMS. She denies any head injury or loss of consciousness. On arrival, she is eating a bag of potato chips.  Patient is a 38 y.o. female presenting with fall and lower extremity injury. The history is provided by the patient.  Fall  Ankle Injury    History reviewed. No pertinent past medical history. History reviewed. No pertinent past surgical history. No family history on file. History  Substance Use Topics  . Smoking status: Current Every Day Smoker -- 1.00 packs/day    Types: Cigarettes  . Smokeless tobacco: Not on file  . Alcohol Use: No   OB History    No data available     Review of Systems  Musculoskeletal:       + R ankle pain and swelling.  All other systems reviewed and are negative.     Allergies  Review of patient's allergies indicates no known allergies.  Home Medications   Prior to Admission medications   Medication Sig Start Date End Date Taking? Authorizing Provider  oxyCODONE-acetaminophen (PERCOCET) 5-325 MG per tablet Take 1-2 tablets by mouth every 6 (six) hours as needed for severe pain. 09/01/14   Bertel Venard M Antoria Lanza, PA-C   BP 104/55 mmHg  Pulse 84  Temp(Src) 98.2 F (36.8 C) (Oral)  Resp 20  SpO2 96%  LMP 08/30/2014 Physical Exam  Constitutional: She is oriented to person, place, and time. She appears well-developed and  well-nourished. No distress.  HENT:  Head: Normocephalic and atraumatic.  Mouth/Throat: Oropharynx is clear and moist.  Eyes: Conjunctivae and EOM are normal.  Neck: Normal range of motion. Neck supple.  Cardiovascular: Normal rate, regular rhythm and normal heart sounds.   Pulmonary/Chest: Effort normal and breath sounds normal. No respiratory distress.  Musculoskeletal:  R ankle with swelling throughout, no deformity. Tender both medial and laterally. +2 DP/PT pulse. Achilles tendon normal.  Neurological: She is alert and oriented to person, place, and time. No sensory deficit.  Skin: Skin is warm and dry.  Psychiatric: She has a normal mood and affect. Her behavior is normal.  Nursing note and vitals reviewed.   ED Course  Procedures (including critical care time) Labs Review Labs Reviewed - No data to display  Imaging Review Dg Ankle Complete Right  09/01/2014   CLINICAL DATA:  Patient fell downstairs at home a yesterday with persistent right ankle pain, swelling and bruising. Initial encounter.  EXAM: RIGHT ANKLE - COMPLETE 3+ VIEW  COMPARISON:  None.  FINDINGS: There is a bimalleolar fracture/ dislocation involving the medial and lateral malleoli with marked disruption of the ankle mortise with the medial aspect of the ankle mortise now measuring approximately 1 cm. Several tiny displaced ossicles are noted about the anterior aspect of the tibiotalar joint. A definitive posterior malleolar fracture is not identified. Expected diffuse soft tissue swelling. No radiopaque foreign body.  IMPRESSION: Bimalleolar ankle fracture/dislocation with marked  widening and disruption of the ankle mortise.   Electronically Signed   By: Simonne ComeJohn  Watts M.D.   On: 09/01/2014 19:38     EKG Interpretation None      MDM   Final diagnoses:  Bimalleolar fracture, right, closed, initial encounter  Fall from slip, trip, or stumble, initial encounter   Neurovascularly intact. Fracture is closed. I  spoke with Dr. Ophelia CharterYates, ortho on call who advised to splint patient and have her follow up in the office in the morning. Pt splinted, will d/c with percocet. Stable for d/c. Return precautions given. Patient states understanding of treatment care plan and is agreeable.  Discussed with attending Dr. Criss AlvineGoldston who agrees with plan of care.   Kathrynn SpeedRobyn M Keilani Terrance, PA-C 09/01/14 2022  Audree CamelScott T Goldston, MD 09/11/14 [phone]

## 2014-09-01 NOTE — Discharge Instructions (Signed)
Take percocet for severe pain only. No driving or operating heavy machinery while taking percocet. This medication may cause drowsiness.  Ankle Fracture A fracture is a break in a bone. The ankle joint is made up of three bones. These include the lower (distal)sections of your lower leg bones, called the tibia and fibula, along with a bone in your foot, called the talus. Depending on how bad the break is and if more than one ankle joint bone is broken, a cast or splint is used to protect and keep your injured bone from moving while it heals. Sometimes, surgery is required to help the fracture heal properly.  There are two general types of fractures:  Stable fracture. This includes a single fracture line through one bone, with no injury to ankle ligaments. A fracture of the talus that does not have any displacement (movement of the bone on either side of the fracture line) is also stable.  Unstable fracture. This includes more than one fracture line through one or more bones in the ankle joint. It also includes fractures that have displacement of the bone on either side of the fracture line. CAUSES  A direct blow to the ankle.   Quickly and severely twisting your ankle.  Trauma, such as a car accident or falling from a significant height. RISK FACTORS You may be at a higher risk of ankle fracture if:  You have certain medical conditions.  You are involved in high-impact sports.  You are involved in a high-impact car accident. SIGNS AND SYMPTOMS   Tender and swollen ankle.  Bruising around the injured ankle.  Pain on movement of the ankle.  Difficulty walking or putting weight on the ankle.  A cold foot below the site of the ankle injury. This can occur if the blood vessels passing through your injured ankle were also damaged.  Numbness in the foot below the site of the ankle injury. DIAGNOSIS  An ankle fracture is usually diagnosed with a physical exam and X-rays. A CT scan may  also be required for complex fractures. TREATMENT  Stable fractures are treated with a cast or splint and using crutches to avoid putting weight on your injured ankle. This is followed by an ankle strengthening program. Some patients require a special type of cast, depending on other medical problems they may have. Unstable fractures require surgery to ensure the bones heal properly. Your health care provider will tell you what type of fracture you have and the best treatment for your condition. HOME CARE INSTRUCTIONS   Review correct crutch use with your health care provider and use your crutches as directed. Safe use of crutches is extremely important. Misuse of crutches can cause you to fall or cause injury to nerves in your hands or armpits.  Do not put weight or pressure on the injured ankle until directed by your health care provider.  To lessen the swelling, keep the injured leg elevated while sitting or lying down.  Apply ice to the injured area:  Put ice in a plastic bag.  Place a towel between your cast and the bag.  Leave the ice on for 20 minutes, 2-3 times a day.  If you have a plaster or fiberglass cast:  Do not try to scratch the skin under the cast with any objects. This can increase your risk of skin infection.  Check the skin around the cast every day. You may put lotion on any red or sore areas.  Keep your cast dry  and clean.  If you have a plaster splint:  Wear the splint as directed.  You may loosen the elastic around the splint if your toes become numb, tingle, or turn cold or blue.  Do not put pressure on any part of your cast or splint; it may break. Rest your cast only on a pillow the first 24 hours until it is fully hardened.  Your cast or splint can be protected during bathing with a plastic bag sealed to your skin with medical tape. Do not lower the cast or splint into water.  Take medicines as directed by your health care provider. Only take  over-the-counter or prescription medicines for pain, discomfort, or fever as directed by your health care provider.  Do not drive a vehicle until your health care provider specifically tells you it is safe to do so.  If your health care provider has given you a follow-up appointment, it is very important to keep that appointment. Not keeping the appointment could result in a chronic or permanent injury, pain, and disability. If you have any problem keeping the appointment, call the facility for assistance. SEEK MEDICAL CARE IF: You develop increased swelling or discomfort. SEEK IMMEDIATE MEDICAL CARE IF:   Your cast gets damaged or breaks.  You have continued severe pain.  You develop new pain or swelling after the cast was put on.  Your skin or toenails below the injury turn blue or gray.  Your skin or toenails below the injury feel cold, numb, or have loss of sensitivity to touch.  There is a bad smell or pus draining from under the cast. MAKE SURE YOU:   Understand these instructions.  Will watch your condition.  Will get help right away if you are not doing well or get worse. Document Released: 10/01/2000 Document Revised: 10/09/2013 Document Reviewed: 05/03/2013 [facility] Patient Information 2015 [place], [place]. This information is not intended to replace advice given to you by your health care provider. Make sure you discuss any questions you have with your health care provider.  Bimalleolar Fracture, Ankle, Adult, Displaced (ORIF), Care After Read the instructions outlined below and refer to this sheet in the next few weeks. These discharge instructions provide you with general information on caring for yourself after you leave the hospital. Your doctor may also give you specific instructions. While your treatment has been planned according to the most current medical practices available, unavoidable complications occasionally occur. If you have any problems or questions after  discharge, please call your caregiver. HOME CARE INSTRUCTIONS  You may resume normal diet and activities as directed or allowed. Use crutches as instructed.  Keep ice packs (a bag of ice wrapped in a towel) on the surgical area for 15-20 minutes, 03-04 times per day, for the first two days following surgery. Use the ice only if OK with your surgeon or caregiver.  Change dressings if necessary or as directed.  If you have a plaster or fiberglass splint or cast:  Do not try to scratch the skin under the cast using sharp or pointed objects.  Check the skin around the cast every day. You may put lotion on any red or sore areas.  Keep your cast or splint dry and clean.  Do not put pressure on any part of your cast or splint until it is fully hardened.  Your cast or splint can be protected during bathing with a plastic bag. Do not lower the cast or splint into water.  Take prescribed medication  as directed. Only take over-the-counter or prescription medicines for pain, discomfort, or fever as directed by your caregiver.  Use crutches as directed and do not exercise leg unless instructed.  These are not fractures to be taken lightly! If the fracture displaces and gets out of position, it may eventually lead to arthritis and disability for the rest of your life. Problems often follow even the best of care.  Follow all instructions given to you by your caregiver, make and keep follow up appointments. SEEK IMMEDIATE MEDICAL CARE IF:  You develop redness, swelling, numbness or increasing pain in the wound.  There is pus coming from the wound.  An unexplained oral temperature above 102 F (38.9 C) develops.  A bad smell is coming from the wound or dressing.  A breaking open of the wound (edges not staying together) occurs after stitches or staples have been removed. If you do not have a window in your cast for observing the wound, a discharge or minor bleeding may show up as a stain on  the outside of your cast immediately after surgery. Report these findings to your caregiver. Document Released: 04/23/2005 Document Revised: 07/25/2013 Document Reviewed: 04/15/2009 [facility] Patient Information 2015 [place]. This information is not intended to replace advice given to you by your health care provider. Make sure you discuss any questions you have with your health care provider.

## 2014-09-01 NOTE — ED Notes (Signed)
Bed: WA03 Expected date:  Expected time:  Means of arrival:  Comments: Hold for T1 

## 2014-09-03 ENCOUNTER — Other Ambulatory Visit (HOSPITAL_COMMUNITY): Payer: Self-pay | Admitting: Orthopaedic Surgery

## 2014-09-04 NOTE — H&P (Signed)
Crystal Webster is an 38 y.o. female.   Chief Complaint: right ankle fracture HPI: Pt fell down a flight of stairs and injured her right ankle on 09/01/2014.  She [facility] and [facility] with posterior splint and referred to DR Ophelia CharterYates for definitive treatment.  Pt has a displaced right bimalleolar ankle fracture that will require ORIF.  Pt wishes to proceed.  No past medical history on file.  No past surgical history on file.  No family history on file. Social History:  reports that she has been smoking Cigarettes.  She has been smoking about 1.00 pack per day. She does not have any smokeless tobacco history on file. She reports that she does not drink alcohol or use illicit drugs.  Allergies: No Known Allergies  No prescriptions prior to admission    No results found for this or any previous visit (from the past 48 hour(s)). No results found.  Review of Systems  Musculoskeletal:       Right ankle pain.  Inability to weight bear.  All other systems reviewed and are negative.   Last menstrual period 08/30/2014. Physical Exam  Constitutional: She is oriented to person, place, and time. She appears well-developed and well-nourished.  HENT:  Head: Normocephalic and atraumatic.  Eyes: EOM are normal. Pupils are equal, round, and reactive to light.  Neck: Normal range of motion.  Cardiovascular: Normal rate.   Respiratory: Effort normal.  GI: Soft.  Musculoskeletal:  Right LE in posterior splint.  Cap refill and sensation of toes intact.  Neurological: She is alert and oriented to person, place, and time.  Skin: Skin is warm and dry.     Assessment/Plan Displaced right bimalleolar ankle fracture. PLAN:  ORIF of right displaced bimalleolar ankle fracture  Dayna Geurts M 09/04/2014, 12:08 PM

## 2014-09-05 ENCOUNTER — Encounter (HOSPITAL_COMMUNITY)
Admission: RE | Admit: 2014-09-05 | Discharge: 2014-09-05 | Disposition: A | Discharge status: Home / Self Care | Payer: Self-pay | Source: Ambulatory Visit | Attending: Orthopaedic Surgery | Admitting: Orthopaedic Surgery

## 2014-09-05 ENCOUNTER — Encounter (HOSPITAL_COMMUNITY): Payer: Self-pay

## 2014-09-05 DIAGNOSIS — Z01812 Encounter for preprocedural laboratory examination: Secondary | ICD-10-CM | POA: Insufficient documentation

## 2014-09-05 HISTORY — DX: Nicotine dependence, unspecified, uncomplicated: F17.200

## 2014-09-05 HISTORY — DX: Displaced bimalleolar fracture of unspecified lower leg, initial encounter for closed fracture: S82.843A

## 2014-09-05 LAB — CBC
HEMATOCRIT: 41.2 % (ref 36.0–46.0)
HEMOGLOBIN: 13.7 g/dL (ref 12.0–15.0)
MCH: 31.8 pg (ref 26.0–34.0)
MCHC: 33.3 g/dL (ref 30.0–36.0)
MCV: 95.6 fL (ref 78.0–100.0)
Platelets: 260 10*3/uL (ref 150–400)
RBC: 4.31 MIL/uL (ref 3.87–5.11)
RDW: 12.3 % (ref 11.5–15.5)
WBC: 7.1 10*3/uL (ref 4.0–10.5)

## 2014-09-05 LAB — HCG, SERUM, QUALITATIVE: Preg, Serum: NEGATIVE

## 2014-09-05 MED ORDER — CEFAZOLIN SODIUM-DEXTROSE 2-3 GM-% IV SOLR
2.0000 g | INTRAVENOUS | Status: AC
  Administered 2014-09-06: 2 g via INTRAVENOUS
  Filled 2014-09-05: qty 50

## 2014-09-05 NOTE — Pre-Procedure Instructions (Signed)
Crystal SoxShaquana C Webster  09/05/2014   Your procedure is scheduled on:  Friday, November 20  [facility] at 0730 AM.  Call this number if you have problems the morning of surgery: [phone]   Remember:   Do not eat food or drink liquids after midnight. Thursday   Take these medicines the morning of surgery with A SIP OF WATER: Pain medication if needed   Do not wear jewelry, make-up or nail polish.  Do not wear lotions, powders, or perfumes. You may wear deodorant.  Do not shave 48 hours prior to surgery.    Do not bring valuables to the hospital.  [facility] is not responsible    for any belongings or valuables.               Contacts, dentures or bridgework may not be worn into surgery.  Leave suitcase in the car. After surgery it may be brought to your room.  For patients admitted to the hospital, discharge time is determined by your  treatment team.               Patients discharged the day of surgery will not be allowed to drive  home.  Name and phone number of your driver     Special Instructions: [facility] - Pre[facility]  Before surgery, you can play an important role.  Because skin is not sterile, your skin needs to be as free of germs as possible.  You can reduce the number of germs on you skin by washing with CHG (chlorahexidine gluconate) soap before surgery.  CHG is an antiseptic cleaner which kills germs and bonds with the skin to continue killing germs even after washing.  Please DO NOT use if you have an allergy to CHG or antibacterial soaps.  If your skin becomes reddened/irritated stop using the CHG and inform your nurse when you arrive at Short Stay.  Do not shave (including legs and underarms) for at least 48 hours prior to the first CHG shower.  You may shave your face.  Please follow these instructions carefully:   1.  Shower with CHG Soap the night before surgery and the   morning of Surgery.  2.  If you choose to wash  your hair, wash your hair first as usual with your   normal shampoo.  3.  After you shampoo, rinse your hair and body thoroughly to remove the   Shampoo.  4.  Use CHG as you would any other liquid soap.  You can apply chg directly to the skin and wash gently with scrungie or a clean washcloth.  5.  Apply the CHG Soap to your body ONLY FROM THE NECK DOWN.    Do not use on open wounds or open sores.  Avoid contact with your eyes,   ears, mouth and genitals (private parts).  Wash genitals (private parts)  with your normal soap.  6.  Wash thoroughly, paying special attention to the area where your surgery   will be performed.  7.  Thoroughly rinse your body with warm water from the neck down.  8.  DO NOT shower/wash with your normal soap after using and rinsing off   the CHG Soap.  9.  Pat yourself dry with a clean towel.            10.  Wear clean pajamas.            11.  Place clean sheets  on your bed the night of your first shower and do not  sleep with pets.  Day of Surgery  Do not apply any lotions/deoderants the morning of surgery.  Please wear clean clothes to the hospital/surgery center.     Please read over the following fact sheets that you were given: Pain Booklet, Coughing and Deep Breathing and Surgical Site Infection Prevention

## 2014-09-05 NOTE — Anesthesia Preprocedure Evaluation (Addendum)
Anesthesia Evaluation  Patient identified by MRN, date of birth, ID band Patient awake    Reviewed: Allergy & Precautions, H&P , NPO status , Patient's Chart, lab work & pertinent test results, reviewed documented beta blocker date and time   History of Anesthesia Complications Negative for: history of anesthetic complications  Airway Mallampati: II  TM Distance: >3 FB Neck ROM: Full    Dental  (+) Teeth Intact, Dental Advisory Given   Pulmonary Current Smoker (20 pack yearhx),  breath sounds clear to auscultation        Cardiovascular negative cardio ROS  Rhythm:Regular     Neuro/Psych    GI/Hepatic negative GI ROS, Neg liver ROS,   Endo/Other  negative endocrine ROS  Renal/GU negative Renal ROS     Musculoskeletal   Abdominal (+)  Abdomen: soft.    Peds  Hematology negative hematology ROS (+)   Anesthesia Other Findings   Reproductive/Obstetrics negative OB ROS                           Anesthesia Physical Anesthesia Plan  ASA: II  Anesthesia Plan: General   Post-op Pain Management:    Induction: Intravenous  Airway Management Planned: LMA and Oral ETT  Additional Equipment:   Intra-op Plan:   Post-operative Plan: Extubation in OR  Informed Consent: I have reviewed the patients History and Physical, chart, labs and discussed the procedure including the risks, benefits and alternatives for the proposed anesthesia with the patient or authorized representative who has indicated his/her understanding and acceptance.     Plan Discussed with:   Anesthesia Plan Comments:         Anesthesia Quick Evaluation

## 2014-09-06 ENCOUNTER — Observation Stay (HOSPITAL_COMMUNITY)
Admission: RE | Admit: 2014-09-06 | Discharge: 2014-09-07 | Disposition: A | Discharge status: Home / Self Care | Payer: Medicaid Other | Source: Ambulatory Visit | Attending: Orthopaedic Surgery | Admitting: Orthopaedic Surgery

## 2014-09-06 ENCOUNTER — Ambulatory Visit (HOSPITAL_COMMUNITY): Payer: Medicaid Other

## 2014-09-06 ENCOUNTER — Encounter (HOSPITAL_COMMUNITY): Payer: Self-pay | Admitting: *Deleted

## 2014-09-06 ENCOUNTER — Ambulatory Visit (HOSPITAL_COMMUNITY): Payer: Medicaid Other | Admitting: Anesthesiology

## 2014-09-06 ENCOUNTER — Encounter (HOSPITAL_COMMUNITY)
Admission: RE | Disposition: A | Discharge status: Home / Self Care | Payer: Self-pay | Source: Ambulatory Visit | Attending: Orthopaedic Surgery

## 2014-09-06 DIAGNOSIS — F17210 Nicotine dependence, cigarettes, uncomplicated: Secondary | ICD-10-CM | POA: Insufficient documentation

## 2014-09-06 DIAGNOSIS — Y998 Other external cause status: Secondary | ICD-10-CM | POA: Diagnosis not present

## 2014-09-06 DIAGNOSIS — W108XXA Fall (on) (from) other stairs and steps, initial encounter: Secondary | ICD-10-CM | POA: Diagnosis not present

## 2014-09-06 DIAGNOSIS — Y9289 Other specified places as the place of occurrence of the external cause: Secondary | ICD-10-CM | POA: Diagnosis not present

## 2014-09-06 DIAGNOSIS — S82843A Displaced bimalleolar fracture of unspecified lower leg, initial encounter for closed fracture: Secondary | ICD-10-CM | POA: Diagnosis present

## 2014-09-06 DIAGNOSIS — S82841A Displaced bimalleolar fracture of right lower leg, initial encounter for closed fracture: Secondary | ICD-10-CM | POA: Diagnosis not present

## 2014-09-06 DIAGNOSIS — Y9389 Activity, other specified: Secondary | ICD-10-CM | POA: Diagnosis not present

## 2014-09-06 HISTORY — PX: ORIF ANKLE FRACTURE: SHX5408

## 2014-09-06 SURGERY — OPEN REDUCTION INTERNAL FIXATION (ORIF) ANKLE FRACTURE
Anesthesia: General | Site: Ankle | Laterality: Right

## 2014-09-06 MED ORDER — ZOLPIDEM TARTRATE 5 MG PO TABS: 5.0000 mg | ORAL_TABLET | Freq: Every evening | ORAL | Status: DC | PRN

## 2014-09-06 MED ORDER — ASPIRIN EC 325 MG PO TBEC
325.0000 mg | DELAYED_RELEASE_TABLET | Freq: Every day | ORAL | Status: DC
  Administered 2014-09-06 – 2014-09-07 (×2): 325 mg via ORAL
  Filled 2014-09-06 (×2): qty 1

## 2014-09-06 MED ORDER — FENTANYL CITRATE 0.05 MG/ML IJ SOLN
INTRAMUSCULAR | Status: AC
  Filled 2014-09-06: qty 2

## 2014-09-06 MED ORDER — BUPIVACAINE HCL (PF) 0.25 % IJ SOLN
INTRAMUSCULAR | Status: AC
  Filled 2014-09-06: qty 30

## 2014-09-06 MED ORDER — ONDANSETRON HCL 4 MG/2ML IJ SOLN: 4.0000 mg | Freq: Four times a day (QID) | INTRAMUSCULAR | Status: DC | PRN

## 2014-09-06 MED ORDER — FENTANYL CITRATE 0.05 MG/ML IJ SOLN
INTRAMUSCULAR | Status: AC
  Filled 2014-09-06: qty 5

## 2014-09-06 MED ORDER — MIDAZOLAM HCL 2 MG/2ML IJ SOLN
INTRAMUSCULAR | Status: AC
  Filled 2014-09-06: qty 2

## 2014-09-06 MED ORDER — SENNOSIDES-DOCUSATE SODIUM 8.6-50 MG PO TABS: 1.0000 | ORAL_TABLET | Freq: Every evening | ORAL | Status: DC | PRN

## 2014-09-06 MED ORDER — BISACODYL 10 MG RE SUPP: 10.0000 mg | Freq: Every day | RECTAL | Status: DC | PRN

## 2014-09-06 MED ORDER — ASPIRIN EC 325 MG PO TBEC: 325.0000 mg | DELAYED_RELEASE_TABLET | Freq: Every day | ORAL | Status: DC

## 2014-09-06 MED ORDER — OXYCODONE-ACETAMINOPHEN 5-325 MG PO TABS: 1.0000 | ORAL_TABLET | ORAL | Status: DC | PRN

## 2014-09-06 MED ORDER — SODIUM CHLORIDE 0.9 % IJ SOLN
INTRAMUSCULAR | Status: AC
  Filled 2014-09-06: qty 10

## 2014-09-06 MED ORDER — KETOROLAC TROMETHAMINE 30 MG/ML IJ SOLN
30.0000 mg | Freq: Four times a day (QID) | INTRAMUSCULAR | Status: AC
  Administered 2014-09-06 – 2014-09-07 (×4): 30 mg via INTRAVENOUS
  Filled 2014-09-06 (×3): qty 1

## 2014-09-06 MED ORDER — ONDANSETRON HCL 4 MG PO TABS: 4.0000 mg | ORAL_TABLET | Freq: Four times a day (QID) | ORAL | Status: DC | PRN

## 2014-09-06 MED ORDER — MORPHINE SULFATE 2 MG/ML IJ SOLN
1.0000 mg | INTRAMUSCULAR | Status: DC | PRN
  Administered 2014-09-06 (×2): 1 mg via INTRAVENOUS
  Filled 2014-09-06 (×2): qty 1

## 2014-09-06 MED ORDER — MEPERIDINE HCL 25 MG/ML IJ SOLN: 6.2500 mg | INTRAMUSCULAR | Status: DC | PRN

## 2014-09-06 MED ORDER — FLEET ENEMA 7-19 GM/118ML RE ENEM: 1.0000 | ENEMA | Freq: Once | RECTAL | Status: AC | PRN

## 2014-09-06 MED ORDER — LIDOCAINE HCL (CARDIAC) 20 MG/ML IV SOLN
INTRAVENOUS | Status: DC | PRN
  Administered 2014-09-06: 100 mg via INTRAVENOUS

## 2014-09-06 MED ORDER — KETOROLAC TROMETHAMINE 30 MG/ML IJ SOLN
INTRAMUSCULAR | Status: AC
  Filled 2014-09-06: qty 1

## 2014-09-06 MED ORDER — METHOCARBAMOL 500 MG PO TABS: 500.0000 mg | ORAL_TABLET | Freq: Four times a day (QID) | ORAL | Status: DC | PRN

## 2014-09-06 MED ORDER — MIDAZOLAM HCL 5 MG/5ML IJ SOLN
INTRAMUSCULAR | Status: DC | PRN
  Administered 2014-09-06: 2 mg via INTRAVENOUS

## 2014-09-06 MED ORDER — HYDROMORPHONE HCL 1 MG/ML IJ SOLN
INTRAMUSCULAR | Status: AC
  Administered 2014-09-06: 0.5 mg
  Filled 2014-09-06: qty 1

## 2014-09-06 MED ORDER — FENTANYL CITRATE 0.05 MG/ML IJ SOLN
25.0000 ug | INTRAMUSCULAR | Status: DC | PRN
  Administered 2014-09-06 (×3): 50 ug via INTRAVENOUS

## 2014-09-06 MED ORDER — HYDROCODONE-ACETAMINOPHEN 5-325 MG PO TABS
1.0000 | ORAL_TABLET | ORAL | Status: DC | PRN
  Administered 2014-09-06: 2 via ORAL
  Filled 2014-09-06: qty 2

## 2014-09-06 MED ORDER — METHOCARBAMOL 500 MG PO TABS
500.0000 mg | ORAL_TABLET | Freq: Four times a day (QID) | ORAL | Status: DC | PRN
  Administered 2014-09-07: 500 mg via ORAL
  Filled 2014-09-06: qty 1

## 2014-09-06 MED ORDER — METOCLOPRAMIDE HCL 10 MG PO TABS: 5.0000 mg | ORAL_TABLET | Freq: Three times a day (TID) | ORAL | Status: DC | PRN

## 2014-09-06 MED ORDER — PHENYLEPHRINE 40 MCG/ML (10ML) SYRINGE FOR IV PUSH (FOR BLOOD PRESSURE SUPPORT)
PREFILLED_SYRINGE | INTRAVENOUS | Status: AC
  Filled 2014-09-06: qty 10

## 2014-09-06 MED ORDER — ONDANSETRON HCL 4 MG/2ML IJ SOLN
INTRAMUSCULAR | Status: DC | PRN
  Administered 2014-09-06: 4 mg via INTRAVENOUS

## 2014-09-06 MED ORDER — PROPOFOL 10 MG/ML IV BOLUS
INTRAVENOUS | Status: AC
  Filled 2014-09-06: qty 20

## 2014-09-06 MED ORDER — FENTANYL CITRATE 0.05 MG/ML IJ SOLN
INTRAMUSCULAR | Status: DC | PRN
  Administered 2014-09-06 (×6): 50 ug via INTRAVENOUS

## 2014-09-06 MED ORDER — LIDOCAINE HCL (CARDIAC) 20 MG/ML IV SOLN
INTRAVENOUS | Status: AC
Start: 2014-09-06 — End: 2014-09-06
  Filled 2014-09-06: qty 5

## 2014-09-06 MED ORDER — ROCURONIUM BROMIDE 50 MG/5ML IV SOLN
INTRAVENOUS | Status: AC
  Filled 2014-09-06: qty 1

## 2014-09-06 MED ORDER — GLYCOPYRROLATE 0.2 MG/ML IJ SOLN
INTRAMUSCULAR | Status: AC
  Filled 2014-09-06: qty 1

## 2014-09-06 MED ORDER — KCL IN DEXTROSE-NACL 20-5-0.45 MEQ/L-%-% IV SOLN
INTRAVENOUS | Status: DC
  Administered 2014-09-06 (×2): via INTRAVENOUS
  Filled 2014-09-06 (×3): qty 1000

## 2014-09-06 MED ORDER — METHOCARBAMOL 1000 MG/10ML IJ SOLN
500.0000 mg | Freq: Four times a day (QID) | INTRAVENOUS | Status: DC | PRN
  Filled 2014-09-06: qty 5

## 2014-09-06 MED ORDER — SUCCINYLCHOLINE CHLORIDE 20 MG/ML IJ SOLN
INTRAMUSCULAR | Status: AC
  Filled 2014-09-06: qty 1

## 2014-09-06 MED ORDER — LACTATED RINGERS IV SOLN
INTRAVENOUS | Status: DC | PRN
  Administered 2014-09-06 (×2): via INTRAVENOUS

## 2014-09-06 MED ORDER — HYDROMORPHONE HCL 1 MG/ML IJ SOLN
0.5000 mg | INTRAMUSCULAR | Status: DC | PRN
  Administered 2014-09-06: 0.5 mg via INTRAVENOUS

## 2014-09-06 MED ORDER — EPHEDRINE SULFATE 50 MG/ML IJ SOLN
INTRAMUSCULAR | Status: AC
  Filled 2014-09-06: qty 1

## 2014-09-06 MED ORDER — HYDROMORPHONE HCL 1 MG/ML PO LIQD: 0.5000 mg | ORAL | Status: DC

## 2014-09-06 MED ORDER — 0.9 % SODIUM CHLORIDE (POUR BTL) OPTIME
TOPICAL | Status: DC | PRN
  Administered 2014-09-06: 1000 mL

## 2014-09-06 MED ORDER — ONDANSETRON HCL 4 MG/2ML IJ SOLN
INTRAMUSCULAR | Status: AC
  Filled 2014-09-06: qty 2

## 2014-09-06 MED ORDER — DOCUSATE SODIUM 100 MG PO CAPS
100.0000 mg | ORAL_CAPSULE | Freq: Two times a day (BID) | ORAL | Status: DC
  Administered 2014-09-06 – 2014-09-07 (×3): 100 mg via ORAL
  Filled 2014-09-06 (×4): qty 1

## 2014-09-06 MED ORDER — DEXAMETHASONE SODIUM PHOSPHATE 4 MG/ML IJ SOLN
INTRAMUSCULAR | Status: AC
  Filled 2014-09-06: qty 1

## 2014-09-06 MED ORDER — OXYCODONE-ACETAMINOPHEN 5-325 MG PO TABS
1.0000 | ORAL_TABLET | ORAL | Status: DC | PRN
  Administered 2014-09-06 – 2014-09-07 (×4): 2 via ORAL
  Filled 2014-09-06 (×4): qty 2

## 2014-09-06 MED ORDER — DEXAMETHASONE SODIUM PHOSPHATE 4 MG/ML IJ SOLN
INTRAMUSCULAR | Status: DC | PRN
  Administered 2014-09-06: 4 mg via INTRAVENOUS

## 2014-09-06 MED ORDER — PROPOFOL 10 MG/ML IV BOLUS
INTRAVENOUS | Status: DC | PRN
  Administered 2014-09-06: 50 mg via INTRAVENOUS
  Administered 2014-09-06: 150 mg via INTRAVENOUS

## 2014-09-06 MED ORDER — PROMETHAZINE HCL 25 MG/ML IJ SOLN: 6.2500 mg | INTRAMUSCULAR | Status: DC | PRN

## 2014-09-06 MED ORDER — METOCLOPRAMIDE HCL 5 MG/ML IJ SOLN: 5.0000 mg | Freq: Three times a day (TID) | INTRAMUSCULAR | Status: DC | PRN

## 2014-09-06 MED ORDER — DIPHENHYDRAMINE HCL 12.5 MG/5ML PO ELIX: 12.5000 mg | ORAL_SOLUTION | ORAL | Status: DC | PRN

## 2014-09-06 SURGICAL SUPPLY — 74 items
BANDAGE ELASTIC 4 VELCRO ST LF (GAUZE/BANDAGES/DRESSINGS) ×2 IMPLANT
BANDAGE ELASTIC 6 VELCRO ST LF (GAUZE/BANDAGES/DRESSINGS) ×2 IMPLANT
BANDAGE ESMARK 6X9 LF (GAUZE/BANDAGES/DRESSINGS) IMPLANT
BIT DRILL 110X2.5XQCK CNCT (BIT) IMPLANT
BIT DRILL 2.5 (BIT) ×6
BIT DRILL STD 2.0MM (DRILL) IMPLANT
BIT DRL 110X2.5XQCK CNCT (BIT) ×2
BNDG CMPR 9X6 STRL LF SNTH (GAUZE/BANDAGES/DRESSINGS)
BNDG ESMARK 6X9 LF (GAUZE/BANDAGES/DRESSINGS)
COVER MAYO STAND STRL (DRAPES) ×3 IMPLANT
COVER SURGICAL LIGHT HANDLE (MISCELLANEOUS) ×3 IMPLANT
CUFF TOURNIQUET SINGLE 34IN LL (TOURNIQUET CUFF) ×2 IMPLANT
CUFF TOURNIQUET SINGLE 44IN (TOURNIQUET CUFF) IMPLANT
DRAPE C-ARM 42X72 X-RAY (DRAPES) ×2 IMPLANT
DRAPE C-ARMOR (DRAPES) ×2 IMPLANT
DRAPE INCISE IOBAN 66X45 STRL (DRAPES) ×3 IMPLANT
DRAPE PROXIMA HALF (DRAPES) ×3 IMPLANT
DRAPE U-SHAPE 47X51 STRL (DRAPES) ×3 IMPLANT
DRILL STANDARD 2.0MM (DRILL) ×6
DRSG PAD ABDOMINAL 8X10 ST (GAUZE/BANDAGES/DRESSINGS) ×5 IMPLANT
DURAPREP 26ML APPLICATOR (WOUND CARE) ×3 IMPLANT
ELECT REM PT RETURN 9FT ADLT (ELECTROSURGICAL) ×3
ELECTRODE REM PT RTRN 9FT ADLT (ELECTROSURGICAL) ×1 IMPLANT
GAUZE SPONGE 4X4 12PLY STRL (GAUZE/BANDAGES/DRESSINGS) ×3 IMPLANT
GAUZE XEROFORM 5X9 LF (GAUZE/BANDAGES/DRESSINGS) ×3 IMPLANT
GLOVE BIO SURGEON STRL SZ8.5 (GLOVE) ×4 IMPLANT
GLOVE BIOGEL PI IND STRL 7.5 (GLOVE) ×1 IMPLANT
GLOVE BIOGEL PI IND STRL 8 (GLOVE) ×1 IMPLANT
GLOVE BIOGEL PI INDICATOR 7.5 (GLOVE) ×2
GLOVE BIOGEL PI INDICATOR 8 (GLOVE) ×2
GLOVE ECLIPSE 6.5 STRL STRAW (GLOVE) ×4 IMPLANT
GLOVE ECLIPSE 7.0 STRL STRAW (GLOVE) ×3 IMPLANT
GLOVE ORTHO TXT STRL SZ7.5 (GLOVE) ×3 IMPLANT
GOWN STRL REUS W/ TWL LRG LVL3 (GOWN DISPOSABLE) ×2 IMPLANT
GOWN STRL REUS W/ TWL XL LVL3 (GOWN DISPOSABLE) ×1 IMPLANT
GOWN STRL REUS W/TWL LRG LVL3 (GOWN DISPOSABLE) ×6
GOWN STRL REUS W/TWL XL LVL3 (GOWN DISPOSABLE) ×3
KIT BASIN OR (CUSTOM PROCEDURE TRAY) ×3 IMPLANT
KIT ROOM TURNOVER OR (KITS) ×3 IMPLANT
MANIFOLD NEPTUNE II (INSTRUMENTS) ×3 IMPLANT
NS IRRIG 1000ML POUR BTL (IV SOLUTION) ×3 IMPLANT
PACK ORTHO EXTREMITY (CUSTOM PROCEDURE TRAY) ×3 IMPLANT
PAD ARMBOARD 7.5X6 YLW CONV (MISCELLANEOUS) ×6 IMPLANT
PAD CAST 4YDX4 CTTN HI CHSV (CAST SUPPLIES) ×1 IMPLANT
PADDING CAST COTTON 4X4 STRL (CAST SUPPLIES) ×3
PADDING CAST COTTON 6X4 STRL (CAST SUPPLIES) ×3 IMPLANT
PLATE TIBULA RT PERIART 4H (Plate) ×2 IMPLANT
SCREW 3.5X10 (Screw) ×2 IMPLANT
SCREW 3.5X12 (Screw) ×3 IMPLANT
SCREW BN 12X2.7XCNCL NS CORT (Screw) IMPLANT
SCREW BN 2.7X12X3.5XST NS (Screw) IMPLANT
SCREW CANN 1/3 THRD RVRS CT (Screw) IMPLANT
SCREW CANNULATED 4.0X40 (Screw) ×6 IMPLANT
SCREW CORT 2.5X12X2.7XST SM (Screw) IMPLANT
SCREW CORTICAL 2.7X12 (Screw) ×9 IMPLANT
SCREW CORTICAL 2.7X26 (Screw) ×6 IMPLANT
SCREW LOCK 12X3.5X2.7X NS (Screw) IMPLANT
SCREW LOCKING 3.5X12 (Screw) ×6 IMPLANT
SCREW PERI 3.5X14MM W/2.7 (Screw) ×4 IMPLANT
SCREW PERI CORTICAL 3.5X28MM (Screw) ×2 IMPLANT
SPLINT PLASTER CAST XFAST 5X30 (CAST SUPPLIES) IMPLANT
SPLINT PLASTER XFAST SET 5X30 (CAST SUPPLIES) ×2
SPONGE LAP 18X18 X RAY DECT (DISPOSABLE) ×3 IMPLANT
STAPLER VISISTAT 35W (STAPLE) ×2 IMPLANT
SUCTION FRAZIER TIP 10 FR DISP (SUCTIONS) ×3 IMPLANT
SUT ETHILON 3 0 PS 1 (SUTURE) ×6 IMPLANT
SUT VIC AB 2-0 CT1 27 (SUTURE) ×6
SUT VIC AB 2-0 CT1 TAPERPNT 27 (SUTURE) ×2 IMPLANT
TOWEL OR 17X24 6PK STRL BLUE (TOWEL DISPOSABLE) ×3 IMPLANT
TOWEL OR 17X26 10 PK STRL BLUE (TOWEL DISPOSABLE) ×3 IMPLANT
TUBE CONNECTING 12'X1/4 (SUCTIONS) ×1
TUBE CONNECTING 12X1/4 (SUCTIONS) ×2 IMPLANT
WATER STERILE IRR 1000ML POUR (IV SOLUTION) ×1 IMPLANT
YANKAUER SUCT BULB TIP NO VENT (SUCTIONS) ×3 IMPLANT

## 2014-09-06 NOTE — Brief Op Note (Cosign Needed)
09/06/2014  9:27 AM  PATIENT:  Crystal Webster  38 y.o. female  PRE-OPERATIVE DIAGNOSIS:  Right Bimalleolar Ankle Fracture  POST-OPERATIVE DIAGNOSIS:  Right Bimalleolar Ankle Fracture  PROCEDURE:  Procedure(s): OPEN REDUCTION INTERNAL FIXATION (ORIF) BIMALLEOLAR ANKLE FRACTURE (Right)  SURGEON:  Surgeon(s) and Role:    * Eldred MangesMark C Yates, MD - Primary  PHYSICIAN ASSISTANT: Maud DeedSheila Lorcan Shelp PAC  ASSISTANTS: none   ANESTHESIA:   general  EBL:  Total I/O In: 1000 [I.V.:1000] Out: -   BLOOD ADMINISTERED:none  DRAINS: none   LOCAL MEDICATIONS USED:  NONE  SPECIMEN:  No Specimen  DISPOSITION OF SPECIMEN:  N/A  COUNTS:  YES  TOURNIQUET:   Total Tourniquet Time Documented: Thigh (Right) - 58 minutes Total: Thigh (Right) - 58 minutes   DICTATION: .Note written in EPIC  PLAN OF CARE: Admit for overnight observation  PATIENT DISPOSITION:  PACU - hemodynamically stable.   Delay start of Pharmacological VTE agent (>24hrs) due to surgical blood loss or risk of bleeding: no

## 2014-09-06 NOTE — Transfer of Care (Signed)
Immediate Anesthesia Transfer of Care Note  Patient: Crystal Webster  Procedure(s) Performed: Procedure(s): OPEN REDUCTION INTERNAL FIXATION (ORIF) BIMALLEOLAR ANKLE FRACTURE (Right)  Patient Location: PACU  Anesthesia Type:General  Level of Consciousness: awake, alert , oriented and patient cooperative  Airway & Oxygen Therapy: Patient Spontanous Breathing  Post-op Assessment: Report given to PACU RN and Post -op Vital signs reviewed and stable  Post vital signs: Reviewed  Complications: No apparent anesthesia complications

## 2014-09-06 NOTE — Interval H&P Note (Signed)
History and Physical Interval Note:  09/06/2014 7:23 AM  Crystal Webster  has presented today for surgery, with the diagnosis of Right Bimalleolar Ankle Fracture  The various methods of treatment have been discussed with the patient and family. After consideration of risks, benefits and other options for treatment, the patient has consented to  Procedure(s): OPEN REDUCTION INTERNAL FIXATION (ORIF) BIMALLEOLAR ANKLE FRACTURE (Right) as a surgical intervention .  The patient's history has been reviewed, patient examined, no change in status, stable for surgery.  I have reviewed the patient's chart and labs.  Questions were answered to the patient's satisfaction.     [name] C

## 2014-09-06 NOTE — Op Note (Signed)
Crystal Webster:  Redford, Crystal               ACCOUNT NO.:  [id number]  MEDICAL RECORD NO.:  [id number]  LOCATION:  [facility]                         FACILITY:  [facility]  PHYSICIAN:  Lottie Sigman C. Ophelia CharterYates, M.D.    DATE OF BIRTH:  October 30, 1975  DATE OF PROCEDURE:  09/06/2014 DATE OF DISCHARGE:                              OPERATIVE REPORT   PREOPERATIVE DIAGNOSIS:  Unstable right closed bimalleolar ankle fracture.  POSTOPERATIVE DIAGNOSIS:  Unstable right closed bimalleolar ankle fracture.  PROCEDURE:  Open reduction and internal fixation, right medial and lateral malleolus (bimalleolar) ankle fracture.  SURGEON:  Datra Clary C. Ophelia CharterYates, MD.  ASSISTANT:  Wende NeighborsSheila M. Vernon, PA-C.  ANESTHESIA:  Preoperative popliteal block plus general anesthesia.  IMPLANTS:  Zimmer stainless steel anatomic distal fibular plate, locking, nonlocking screws, and a 40 mm cancellous lag screws medially.  TOURNIQUET TIME:  Less than an hour.  PROCEDURE:  After standard prepping and draping, time-out procedure, Ancef prophylaxis, extremity sheets and drapes, sterile glove over the toes, sterile skin marker, a medial incision was made after elevating the leg wrapping with an Esmarch.  Time-out procedure had been done. Saphenous vein was mobilized.  Fracture hematoma was washed out. Periosteum was caught in the fracture site which was removed.  Incision was made laterally.  Fibular was reduced.  This was a distal fibular fracture.  Short, oblique, and anatomic plate was selected to get 3 screws in the distal fragment.  Initially, reduction was lost; reduction had to be repeated several times and the plate then exactly fit perfectly with the fracture.  Following the distal fragments anatomically fixed, locking screws placed after initial compression screw to pull the plate down and then proximal screws were filled by cortical 12 x 14 mm.  Final spot pictures were taken showing position alignment with good reduction.  Final spot pictures,  AP and lateral mortise were taken.  There was good reduction.  The syndesmosis was reduced and was stable.  Tourniquet deflated.  Irrigation, 2-0 Vicryl in subcutaneous tissue, skin staple closure, postop dressing, and short-leg splint applied.  The patient tolerated the procedure well and transferred to the recovery room in stable condition.    Phylliss Strege C. Ophelia CharterYates, M.D.    MC[facility]/MEDQ  D:  09/06/2014  T:  09/06/2014  Job:  [id number]

## 2014-09-06 NOTE — Anesthesia Procedure Notes (Signed)
Procedure Name: LMA Insertion Date/Time: 09/06/2014 7:41 AM Performed by: Romie MinusOCK, Conny Situ K Pre-anesthesia Checklist: Patient identified, Emergency Drugs available, Suction available, Patient being monitored and Timeout performed Patient Re-evaluated:Patient Re-evaluated prior to inductionOxygen Delivery Method: Circle system utilized Preoxygenation: Pre-oxygenation with 100% oxygen Intubation Type: IV induction LMA: LMA inserted LMA Size: 4.0 Number of attempts: 1 Placement Confirmation: positive ETCO2,  CO2 detector and breath sounds checked- equal and bilateral Dental Injury: Teeth and Oropharynx as per pre-operative assessment  Comments: Soft bite block utilized

## 2014-09-06 NOTE — Progress Notes (Signed)
Orthopedic Tech Progress Note Patient Details:  Crystal Webster 04-27-1976 [id number]  Patient ID: Crystal SoxShaquana C Peltzer, female   DOB: 04-27-1976, 38 y.o.   MRN: [id number] Pt stated that they are independent with bed mobility and do not need the trapeze bar patient helper; RNnotified  Nikki DomCrawford, [name] 09/06/2014, 1:48 PM

## 2014-09-06 NOTE — Discharge Instructions (Signed)
Keep short leg splint and dressing dry. May use water impervious bag or cast bag and tub chair to shower Tape the top of bag to skin to avoid moisture soaking the dressing on the leg. Call if there is odor or saturation of dressing or worsening pain not controlled with medications. Call if fever greater than 101.5. Use crutches or walker no weight bearing on the ankle fracture leg. Please follow up with an appointment with Dr. Ophelia CharterYates  2 weeks from the time of surgery. Elevate as often as possible during the first week after surgery gradually increasing the time the leg is dependent or down there after. If swelling recurrs then elevate again. Wheel chair for longer distances. Take aspirin daily to prevent blood clots

## 2014-09-06 NOTE — Anesthesia Postprocedure Evaluation (Signed)
  Anesthesia Post-op Note  Patient: Crystal Webster  Procedure(s) Performed: Procedure(s): OPEN REDUCTION INTERNAL FIXATION (ORIF) BIMALLEOLAR ANKLE FRACTURE (Right)  Patient Location: PACU  Anesthesia Type:General  Level of Consciousness: awake and alert   Airway and Oxygen Therapy: Patient Spontanous Breathing and Patient connected to nasal cannula oxygen  Post-op Pain: mild  Post-op Assessment: Post-op Vital signs reviewed, Patient's Cardiovascular Status Stable, Respiratory Function Stable, Patent Airway and No signs of Nausea or vomiting  Post-op Vital Signs: Reviewed and stable  Last Vitals:  Filed Vitals:   09/06/14 0945  BP:   Pulse: 77  Temp:   Resp: 14    Complications: No apparent anesthesia complications

## 2014-09-07 DIAGNOSIS — S82841A Displaced bimalleolar fracture of right lower leg, initial encounter for closed fracture: Secondary | ICD-10-CM | POA: Diagnosis not present

## 2014-09-07 NOTE — Evaluation (Signed)
Physical Therapy Evaluation and discharge  Patient Details Name: Crystal Webster MRN: [id number] DOB: [birth year] Today's Date: 09/07/2014   History of Present Illness  pt is a 38 y.o. female who fell down the steps and suffered a bimalleolar ankle fx. pt s/p ORIF Rt ankle.   Clinical Impression  Pt s/p surgery above. Pt mobilizing at supervision to mod I. Educated on technique with RW and with crutches for mobility. Reviewed stair management training. Pt feels safe to D/C home with daughter (A). Pt is concerned regarding needs for community transportation due to not being able to drive to grocery store. Pt to benefit from CS[employer] for [employer]. Recommend RW for mobility at this time. Will sign off.     Follow Up Recommendations No PT follow up;Supervision for mobility/OOB (OPPT when WB status is updated)    Equipment Recommendations  Rolling walker with 5" wheels    Recommendations for Other Services       Precautions / Restrictions Precautions Precautions: Fall Restrictions Weight Bearing Restrictions: Yes RLE Weight Bearing: Non weight bearing      Mobility  Bed Mobility Overal bed mobility: Modified Independent             General bed mobility comments: incr time due to pain  Transfers Overall transfer level: Needs assistance Equipment used: Rolling walker (2 wheeled) Transfers: Sit to/from Stand Sit to Stand: Supervision         General transfer comment: cues for technique with RW; pt stated she feels more stable using RW; no sway or LOB noted with sit to stand  Ambulation/Gait Ambulation/Gait assistance: Supervision Ambulation Distance (Feet): 20 Feet Assistive device: Rolling walker (2 wheeled) Gait Pattern/deviations: Step-to pattern Gait velocity: decr Gait velocity interpretation: Below normal speed for age/gender General Gait Details: pt demo good ability to maintain NWB status with use of RW; educated pt on technique with RW ; reviewed  instructons on crutch training and gave visual demonstration; crutches adjusted to proper height ; no LOB noted  Stairs Stairs: Yes       General stair comments: verbally reviewed technique with pt; pt prefers "bumping" up on bottom due to fear of falling and NWB status   Wheelchair Mobility    Modified Rankin (Stroke Patients Only)       Balance Overall balance assessment: History of Falls;No apparent balance deficits (not formally assessed)                                           Pertinent Vitals/Pain Pain Assessment: 0-10 Pain Score: 7  Pain Location: Rt ankle Pain Descriptors / Indicators: Burning;Aching Pain Intervention(s): Monitored during session;Premedicated before session;Repositioned    Home Living Family/patient expects to be discharged to:: Private residence Living Arrangements: Children Available Help at Discharge: Family;Available 24 hours/day Type of Home: Apartment Home Access: Stairs to enter Entrance Stairs-Rails: Right Entrance Stairs-Number of Steps: flight Home Layout: One level Home Equipment: Crutches      Prior Function Level of Independence: Independent         Comments: pt has been ambulating on crutches since fall; has been having difficulty getting around on them per pt     Hand Dominance        Extremity/Trunk Assessment   Upper Extremity Assessment: Overall WFL for tasks assessed           Lower Extremity Assessment: RLE deficits/detail  RLE Deficits / Details: Rt ankle immobilized; pt able to perform SLR     Cervical / Trunk Assessment: Normal  Communication   Communication: No difficulties  Cognition Arousal/Alertness: Awake/alert Behavior During Therapy: WFL for tasks assessed/performed Overall Cognitive Status: Within Functional Limits for tasks assessed                      General Comments General comments (skin integrity, edema, etc.): educated on importance of maintaining  mobility and for elevating Rt LE     Exercises        Assessment/Plan    PT Assessment Patent does not need any further PT services  PT Diagnosis Difficulty walking;Generalized weakness;Acute pain   PT Problem List    PT Treatment Interventions     PT Goals (Current goals can be found in the Care Plan section) Acute Rehab PT Goals Patient Stated Goal: home today PT Goal Formulation: All assessment and education complete, DC therapy    Frequency     Barriers to discharge        Co-evaluation               End of Session Equipment Utilized During Treatment: Gait belt Activity Tolerance: Patient tolerated treatment well Patient left: in bed;with call bell/phone within reach;with family/visitor present Nurse Communication: Mobility status;Precautions;Weight bearing status    Functional Assessment Tool Used: clinical judgement Functional Limitation: Mobility: Walking and moving around Mobility: Walking and Moving Around Current Status ([id number]): At least 1 percent but less than 20 percent impaired, limited or restricted Mobility: Walking and Moving Around Goal Status [phone](G8979): At least 1 percent but less than 20 percent impaired, limited or restricted Mobility: Walking and Moving Around Discharge Status [phone](G8980): At least 1 percent but less than 20 percent impaired, limited or restricted    Time: 0748-0810 PT Time Calculation (min) (ACUTE ONLY): 22 min   Charges:   PT Evaluation $Initial PT Evaluation Tier I: 1 Procedure PT Treatments $Gait Training: 8-22 mins   PT G Codes:   Functional Assessment Tool Used: clinical judgement Functional Limitation: Mobility: Walking and moving around    [place], FloresvilleBrittany N, [place] CarolinaPT  [phone] 09/07/2014, 10:45 AM

## 2014-09-07 NOTE — Plan of Care (Signed)
Problem: Discharge Progression Outcomes Goal: Discharge plan in place and appropriate Outcome: Completed/Met Date Met:  09/07/14 Goal: Pain controlled with appropriate interventions Outcome: Completed/Met Date Met:  09/07/14 Goal: Tubes and drains discontinued if indicated Outcome: Completed/Met Date Met:  09/07/14

## 2014-09-07 NOTE — Discharge Summary (Signed)
Physician Discharge Summary      Patient ID: Crystal Webster MRN: [id number] DOB/AGE: December 18, 1975 38 y.o.  Admit date: 09/06/2014 Discharge date: 09/07/2014  Admission Diagnoses:  See below  Discharge Diagnoses:  Active Problems:   Bimalleolar ankle fracture   Past Medical History  Diagnosis Date  . Ankle fracture, bimalleolar, closed 08/30/2014  . Smoker     Surgeries: Procedure(s): OPEN REDUCTION INTERNAL FIXATION (ORIF) BIMALLEOLAR ANKLE FRACTURE on 09/06/2014   Consultants (if any):    Discharged Condition: Improved  Hospital Course: Crystal Webster is an 38 y.o. female who was admitted 09/06/2014 with a diagnosis of ankle fracture and went to the operating room on 09/06/2014 and underwent the above named procedures.    She was given perioperative antibiotics:      Anti-infectives    Start     Dose/Rate Route Frequency Ordered Stop   09/06/14 0600  ceFAZolin (ANCEF) IVPB 2 g/50 mL premix     2 g100 mL/hr over 30 Minutes Intravenous On call to O.R. 09/05/14 1402 09/06/14 0742    .  She was given sequential compression devices, early ambulation, and aspirin for DVT prophylaxis.  She benefited maximally from the hospital stay and there were no complications.    Recent vital signs:  Filed Vitals:   09/07/14 0624  BP: 116/73  Pulse: 69  Temp: 99 F (37.2 C)  Resp:     Recent laboratory studies:  Lab Results  Component Value Date   HGB 13.7 09/05/2014   Lab Results  Component Value Date   WBC 7.1 09/05/2014   PLT 260 09/05/2014   No results found for: INR No results found for: NA, K, CL, CO2, BUN, CREATININE, GLUCOSE  Discharge Medications:     Medication List    TAKE these medications        acetaminophen 500 MG tablet  Commonly known as:  TYLENOL  Take 1,000 mg by mouth daily as needed for mild pain or moderate pain.     aspirin EC 325 MG tablet  Take 1 tablet (325 mg total) by mouth daily.     methocarbamol 500 MG tablet  Commonly  known as:  ROBAXIN  Take 1 tablet (500 mg total) by mouth every 6 (six) hours as needed for muscle spasms (spasm).     oxyCODONE-acetaminophen 5-325 MG per tablet  Commonly known as:  PERCOCET  Take 1-2 tablets by mouth every 6 (six) hours as needed for severe pain.     oxyCODONE-acetaminophen 5-325 MG per tablet  Commonly known as:  ROXICET  Take 1-2 tablets by mouth every 4 (four) hours as needed for severe pain.        Diagnostic Studies: Dg Ankle Complete Right  09/06/2014   CLINICAL DATA:  ORIF of a bimalleolar right ankle fracture.  EXAM: DG C-ARM 61-120 MIN; RIGHT ANKLE - COMPLETE 3+ VIEW  COMPARISON:  09/01/2014  FINDINGS: Submitted images show placement of a lateral fixation plate and multiple screws across the distal fibular fracture common 2 screws across the medial malleolar fracture. Fracture fragments have been reduced into near anatomic alignment. The talus has been reduced into normal alignment with the tibia.  IMPRESSION: Successful reduction of a displaced bimalleolar ankle fracture following ORIF. No operative complication.   Electronically Signed   By: Amie Portlandavid  Ormond M.D.   On: 09/06/2014 10:37   Dg Ankle Complete Right  09/01/2014   CLINICAL DATA:  Patient fell downstairs at home a yesterday with persistent right ankle pain,  swelling and bruising. Initial encounter.  EXAM: RIGHT ANKLE - COMPLETE 3+ VIEW  COMPARISON:  None.  FINDINGS: There is a bimalleolar fracture/ dislocation involving the medial and lateral malleoli with marked disruption of the ankle mortise with the medial aspect of the ankle mortise now measuring approximately 1 cm. Several tiny displaced ossicles are noted about the anterior aspect of the tibiotalar joint. A definitive posterior malleolar fracture is not identified. Expected diffuse soft tissue swelling. No radiopaque foreign body.  IMPRESSION: Bimalleolar ankle fracture/dislocation with marked widening and disruption of the ankle mortise.    Electronically Signed   By: Simonne ComeJohn  Watts M.D.   On: 09/01/2014 19:38   Dg C-arm 1-60 Min  09/06/2014   CLINICAL DATA:  ORIF of a bimalleolar right ankle fracture.  EXAM: DG C-ARM 61-120 MIN; RIGHT ANKLE - COMPLETE 3+ VIEW  COMPARISON:  09/01/2014  FINDINGS: Submitted images show placement of a lateral fixation plate and multiple screws across the distal fibular fracture common 2 screws across the medial malleolar fracture. Fracture fragments have been reduced into near anatomic alignment. The talus has been reduced into normal alignment with the tibia.  IMPRESSION: Successful reduction of a displaced bimalleolar ankle fracture following ORIF. No operative complication.   Electronically Signed   By: Amie Portlandavid  Ormond M.D.   On: 09/06/2014 10:37    Disposition: 01-Home or Self Care  Discharge Instructions    Call MD / Call 911    Complete by:  As directed   If you experience chest pain or shortness of breath, CALL 911 and be transported to the hospital emergency room.  If you develope a fever above 101.5 F, pus (white drainage) or increased drainage or redness at the wound, or calf pain, call your surgeon's office.     Constipation Prevention    Complete by:  As directed   Drink plenty of fluids.  Prune juice may be helpful.  You may use a stool softener, such as Colace (over the counter) 100 mg twice a day.  Use MiraLax (over the counter) for constipation as needed.     Diet - low sodium heart healthy    Complete by:  As directed      Diet general    Complete by:  As directed      Driving restrictions    Complete by:  As directed   No driving while taking narcotic pain meds.     Increase activity slowly as tolerated    Complete by:  As directed      Non weight bearing    Complete by:  As directed   Laterality:  right  Extremity:  Lower           Follow-up Information    Follow up with YATES,MARK C, MD. Schedule an appointment as soon as possible for a visit in 2 weeks.   Specialty:   Orthopedic Surgery   Contact information:   [address] [place] [address] [phone]        Signed: Cheral AlmasXu, Naiping Michael 09/07/2014, 7:52 AM

## 2014-09-07 NOTE — Plan of Care (Signed)
Problem: Discharge Progression Outcomes Goal: Barriers To Progression Addressed/Resolved Outcome: Completed/Met Date Met:  09/07/14 Goal: Hemodynamically stable Outcome: Completed/Met Date Met:  29/56/21 Goal: Complications resolved/controlled Outcome: Completed/Met Date Met:  09/07/14 Goal: Tolerating diet Outcome: Completed/Met Date Met:  09/07/14 Goal: Activity appropriate for discharge plan Outcome: Completed/Met Date Met:  09/07/14 Goal: Staples/sutures removed Outcome: Not Applicable Date Met:  30/86/57 Goal: Steri-Strips applied Outcome: Not Applicable Date Met:  84/69/62 Goal: Other Discharge Outcomes/Goals Outcome: Not Applicable Date Met:  95/28/41

## 2014-09-07 NOTE — Plan of Care (Signed)
Problem: Phase III Progression Outcomes Goal: Pain controlled on oral analgesia Outcome: Completed/Met Date Met:  09/07/14 Goal: Activity at appropriate level-compared to baseline (UP IN CHAIR FOR HEMODIALYSIS) Outcome: Completed/Met Date Met:  09/07/14 Goal: Voiding independently Outcome: Completed/Met Date Met:  09/07/14 Goal: IV changed to normal saline lock Outcome: Completed/Met Date Met:  09/07/14 Goal: Nasogastric tube discontinued Outcome: Not Applicable Date Met:  34/35/68 Goal: Discharge plan remains appropriate-arrangements made Outcome: Completed/Met Date Met:  09/07/14 Goal: Demonstrates TCDB, IS independently Outcome: Completed/Met Date Met:  09/07/14 Goal: Other Phase III Outcomes/Goals Outcome: Not Applicable Date Met:  61/68/37

## 2014-09-07 NOTE — Plan of Care (Signed)
Problem: Phase I Progression Outcomes Goal: Pain controlled with appropriate interventions Outcome: Completed/Met Date Met:  09/07/14 Goal: OOB as tolerated unless otherwise ordered Outcome: Completed/Met Date Met:  09/07/14 Goal: Incision/dressings dry and intact Outcome: Completed/Met Date Met:  09/07/14 Goal: Sutures/staples intact Outcome: Not Applicable Date Met:  47/58/30 Goal: Tubes/drains patent Outcome: Not Applicable Date Met:  74/60/02 Goal: Initial discharge plan identified Outcome: Completed/Met Date Met:  09/07/14 Goal: Voiding-avoid urinary catheter unless indicated Outcome: Completed/Met Date Met:  09/06/14 Goal: Vital signs/hemodynamically stable Outcome: Completed/Met Date Met:  09/07/14 Goal: Other Phase I Outcomes/Goals Outcome: Not Applicable Date Met:  98/47/30  Problem: Phase II Progression Outcomes Goal: Pain controlled Outcome: Completed/Met Date Met:  09/07/14 Goal: Progress activity as tolerated unless otherwise ordered Outcome: Completed/Met Date Met:  09/07/14 Goal: Progressing with IS, TCDB Outcome: Completed/Met Date Met:  09/07/14 Goal: Vital signs stable Outcome: Completed/Met Date Met:  09/07/14 Goal: Surgical site without signs of infection Outcome: Completed/Met Date Met:  09/07/14 Goal: Dressings dry/intact Outcome: Completed/Met Date Met:  09/07/14 Goal: Sutures/staples intact Outcome: Not Applicable Date Met:  85/69/43 Goal: Return of bowel function (flatus, BM) IF ABDOMINAL SURGERY: Outcome: Completed/Met Date Met:  09/07/14 Goal: Foley discontinued Outcome: Not Applicable Date Met:  70/05/25 Goal: Discharge plan established Outcome: Completed/Met Date Met:  09/07/14 Goal: Tolerating diet Outcome: Completed/Met Date Met:  09/07/14 Goal: Other Phase II Outcomes/Goals Outcome: Not Applicable Date Met:  91/02/89

## 2014-09-07 NOTE — Progress Notes (Signed)
Dressing, splint c/d/i Foot NVI Up with PT today Dc home after PT  N. Glee ArvinMichael Tinya Cadogan, MD [facility] [phone] 8:15 AM

## 2014-09-11 ENCOUNTER — Encounter (HOSPITAL_COMMUNITY): Payer: Self-pay | Admitting: Orthopaedic Surgery

## 2015-11-12 ENCOUNTER — Encounter (HOSPITAL_COMMUNITY): Payer: Self-pay | Admitting: *Deleted

## 2015-11-12 ENCOUNTER — Emergency Department (HOSPITAL_COMMUNITY)
Admission: EM | Admit: 2015-11-12 | Discharge: 2015-11-12 | Disposition: A | Discharge status: Home / Self Care | Payer: Self-pay | Attending: Emergency Medicine | Admitting: Emergency Medicine

## 2015-11-12 DIAGNOSIS — Z8781 Personal history of (healed) traumatic fracture: Secondary | ICD-10-CM | POA: Insufficient documentation

## 2015-11-12 DIAGNOSIS — Z7982 Long term (current) use of aspirin: Secondary | ICD-10-CM | POA: Insufficient documentation

## 2015-11-12 DIAGNOSIS — F17210 Nicotine dependence, cigarettes, uncomplicated: Secondary | ICD-10-CM | POA: Insufficient documentation

## 2015-11-12 DIAGNOSIS — L700 Acne vulgaris: Secondary | ICD-10-CM | POA: Insufficient documentation

## 2015-11-12 NOTE — ED Notes (Signed)
PT walked out with out DC papers. 

## 2015-11-12 NOTE — ED Notes (Signed)
Declined W/C at D/C and was escorted to lobby by RN. 

## 2015-11-12 NOTE — ED Notes (Signed)
See Pa assessment

## 2015-11-12 NOTE — Discharge Instructions (Signed)
Acne Acne is a skin problem that causes pimples. Acne occurs when the pores in the skin get blocked. The pores may become infected with bacteria, or they may become red, sore, and swollen. Acne is a common skin problem, especially for teenagers. Acne usually goes away over time. CAUSES Each pore contains an oil gland. Oil glands make an oily substance that is called sebum. Acne happens when these glands get plugged with sebum, dead skin cells, and dirt. Then, the bacteria that are normally found in the oil glands multiply and cause inflammation. Acne is commonly triggered by changes in your hormones. These hormonal changes can cause the oil glands to get bigger and to make more sebum. Factors that can make acne worse include:  Hormone changes during:  Adolescence.  Women's menstrual cycles.  Pregnancy.  Oil-based cosmetics and hair products.  Harshly scrubbing the skin.  Strong soaps.  Stress.  Hormone problems that are due to certain diseases.  Long or oily hair rubbing against the skin.  Certain medicines.  Pressure from headbands, backpacks, or shoulder pads.  Exposure to certain oils and chemicals. RISK FACTORS This condition is more likely to develop in:  Teenagers.  People who have a family history of acne. SYMPTOMS Acne often occurs on the face, neck, chest, and upper back. Symptoms include:  Small, red bumps (pimples or papules).  Whiteheads.  Blackheads.  Small, pus-filled pimples (pustules).  Big, red pimples or pustules that feel tender. More severe acne can cause:  An infected area that contains a collection of pus (abscess).  Hard, painful, fluid-filled sacs (cysts).  Scars. DIAGNOSIS This condition is diagnosed with a medical history and physical exam. Blood tests may also be done. TREATMENT Treatment for this condition can vary depending on the severity of your acne. Treatment may include:  Creams and lotions that prevent oil glands from  clogging.  Creams and lotions that treat or prevent infections and inflammation.  Antibiotic medicines that are applied to the skin or taken as a pill.  Pills that decrease sebum production.  Birth control pills.  Light or laser treatments.  Surgery.  Injections of medicine into the affected areas.  Chemicals that cause peeling of the skin. Your health care provider will also recommend the best way to take care of your skin. Good skin care is the most important part of treatment. HOME CARE INSTRUCTIONS Skin Care Take care of your skin as told by your health care provider. You may be told to do these things:  Wash your skin gently at least two times each day, as well as:  After you exercise.  Before you go to bed.  Use mild soap.  Apply a water-based skin moisturizer after you wash your skin.  Use a sunscreen or sunblock with SPF 30 or greater. This is especially important if you are using acne medicines.  Choose cosmetics that will not plug your oil glands (are noncomedogenic). Medicines  Take over-the-counter and prescription medicines only as told by your health care provider.  If you were prescribed an antibiotic medicine, apply or take it as told by your health care provider. Do not stop taking the antibiotic even if your condition improves. General Instructions  Keep your hair clean and off of your face. If you have oily hair, shampoo your hair regularly or daily.  Avoid leaning your chin or forehead against your hands.  Avoid wearing tight headbands or hats.  Avoid picking or squeezing your pimples. That can make your acne worse   and cause scarring.  Keep all follow-up visits as told by your health care provider. This is important.  Shave gently and only when necessary.  Keep a food journal to figure out if any foods are linked with your acne. SEEK MEDICAL CARE IF:  Your acne is not better after eight weeks.  Your acne gets worse.  You have a large  area of skin that is red or tender.  You think that you are having side effects from any acne medicine.   This information is not intended to replace advice given to you by your health care provider. Make sure you discuss any questions you have with your health care provider.   Document Released: 10/01/2000 Document Revised: 06/25/2015 Document Reviewed: 12/11/2014 Elsevier Interactive Patient Education 2016 [employer]  

## 2015-11-12 NOTE — ED Notes (Signed)
PT reports she had a small raised area over LT eye lid that started on Tuesday. Pt denies drainage.

## 2015-11-12 NOTE — ED Provider Notes (Signed)
CSN: [id number]     Arrival date & time 11/12/15  0818 History   First MD Initiated Contact with Patient 11/12/15 [phone]     Chief Complaint  Patient presents with  . Facial Swelling   HPI   40 year old female presents today with nodule to her left eyebrow. Patient reports symptoms started 2 days ago with a small area of redness that has "become more firm and larger. She reports located just below her left eyebrow, with no associated surrounding redness, warmth to touch, or spread in the eye. She has full active range of motion of her ocular movements, no change in vision, no fever, no chill, very minimal pain to palpation. Patient reports prior episode approximately 2 years ago in the same spot hard drainage. She has no other lesions throughout her skin, no chronic history of the same. Patient is not tried any over-the-counter therapies prior to arrival.   Past Medical History  Diagnosis Date  . Ankle fracture, bimalleolar, closed 08/30/2014  . Smoker    Past Surgical History  Procedure Laterality Date  . No past surgeries    . Orif ankle fracture Right 09/06/2014    Procedure: OPEN REDUCTION INTERNAL FIXATION (ORIF) BIMALLEOLAR ANKLE FRACTURE;  Surgeon: Eldred Manges, MD;  Location: [facility] OR;  Service: Orthopedics;  Laterality: Right;   History reviewed. No pertinent family history. Social History  Substance Use Topics  . Smoking status: Current Every Day Smoker -- 1.00 packs/day for 20 years    Types: Cigarettes  . Smokeless tobacco: None  . Alcohol Use: No   OB History    No data available     Review of Systems  All other systems reviewed and are negative.   Allergies  Review of patient's allergies indicates no known allergies.  Home Medications   Prior to Admission medications   Medication Sig Start Date End Date Taking? Authorizing Provider  acetaminophen (TYLENOL) 500 MG tablet Take 1,000 mg by mouth daily as needed for mild pain or moderate pain.    Historical Provider,  MD  aspirin EC 325 MG tablet Take 1 tablet (325 mg total) by mouth daily. 09/06/14   Maud Deed, PA-C  methocarbamol (ROBAXIN) 500 MG tablet Take 1 tablet (500 mg total) by mouth every 6 (six) hours as needed for muscle spasms (spasm). 09/06/14   Maud Deed, PA-C  oxyCODONE-acetaminophen (PERCOCET) 5-325 MG per tablet Take 1-2 tablets by mouth every 6 (six) hours as needed for severe pain. 09/01/14   Kathrynn Speed, PA-C  oxyCODONE-acetaminophen (ROXICET) 5-325 MG per tablet Take 1-2 tablets by mouth every 4 (four) hours as needed for severe pain. 09/06/14   Maud Deed, PA-C   BP 114/95 mmHg  Pulse 86  Temp(Src) 96 F (35.6 C) (Oral)  Resp 18  Ht  (1.6 m)  SpO2 100%   Physical Exam  Constitutional: She is oriented to person, place, and time. She appears well-developed and well-nourished.  HENT:  Head: Normocephalic and atraumatic.  Eyes: Conjunctivae are normal. Pupils are equal, round, and reactive to light. Right eye exhibits no discharge. Left eye exhibits no discharge. No scleral icterus.  Neck: Normal range of motion. No JVD present. No tracheal deviation present.  Pulmonary/Chest: Effort normal. No stridor.  Neurological: She is alert and oriented to person, place, and time. Coordination normal.  Skin:  0.5 cm nodule inferior to the left eyebrow, no fluctuance, no surrounding redness, warmth to touch, or signs of cellulitis  Psychiatric: She has a normal  mood and affect. Her behavior is normal. Judgment and thought content normal.  Nursing note and vitals reviewed.     ED Course  Procedures (including critical care time) Labs Review Labs Reviewed - No data to display  Imaging Review No results found. I have personally reviewed and evaluated these images and lab results as part of my medical decision-making.   EKG Interpretation None      MDM   Final diagnoses:  Cystic acne    Labs:   Imaging:  Consults:  Therapeutics:  Discharge Meds:    Assessment/Plan: 40 year old female presents today with what appears to be cystic acne. She has one nodule, nonfluctuant, ultrasound shows no fluctuance. Abscess to would be amenable to drainage. She has no surrounding cellulitis, no systemic symptoms. Patient will be instructed to use warm compresses, with follow-up in 3 days for reevaluation. She is instructed return immediately if any new or worsening signs or symptoms present. Patient verbalizes understanding and agreement for today's plan and had no further questions or concerns at time of discharge.          Eyvonne Mechanic, PA-C 11/12/15 1610  Benjiman Core, MD 11/13/15 [phone]

## 2015-12-30 IMAGING — RF DG C-ARM 61-120 MIN
1 series · 4 of 4 positions shown · non-contrast
Comparison: 09/01/2014

CLINICAL DATA: ORIF of a bimalleolar right ankle fracture.

EXAM:
DG C-ARM 61-120 MIN; RIGHT ANKLE - COMPLETE 3+ VIEW

[Series 1: run · 4 of 4 slices shown]
[im 1/4]
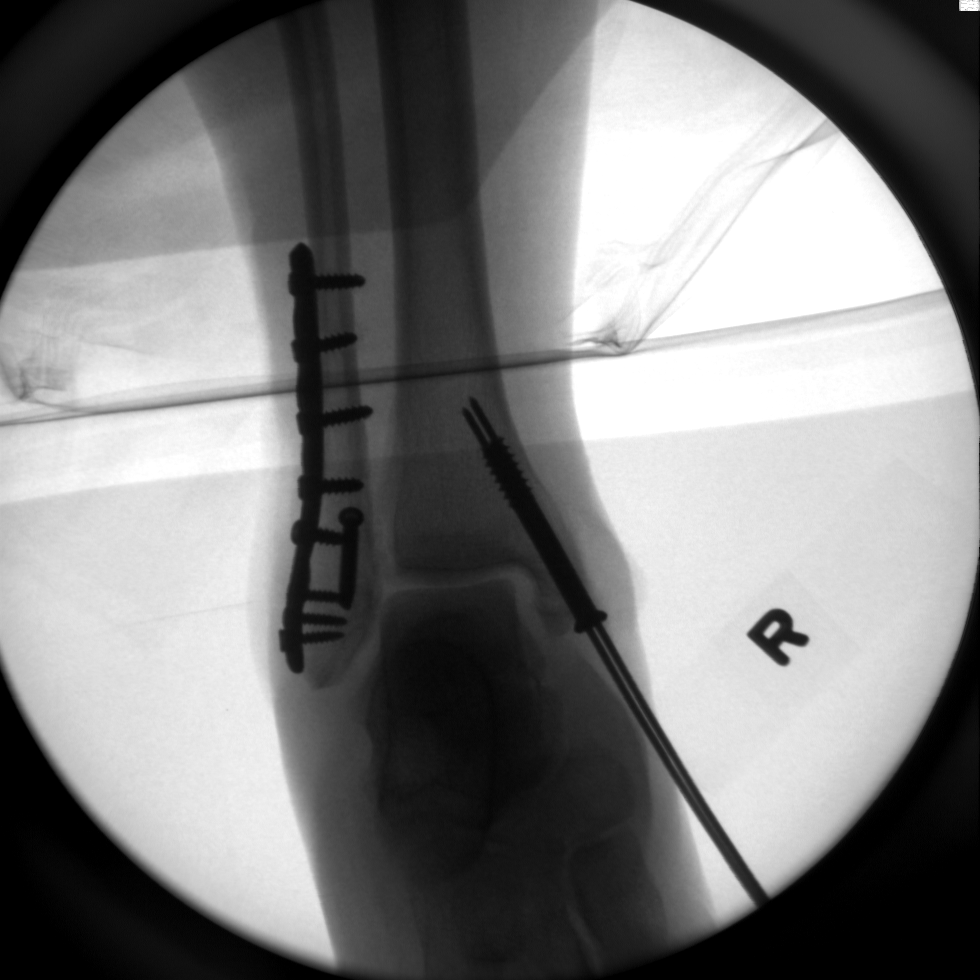
[im 2/4]
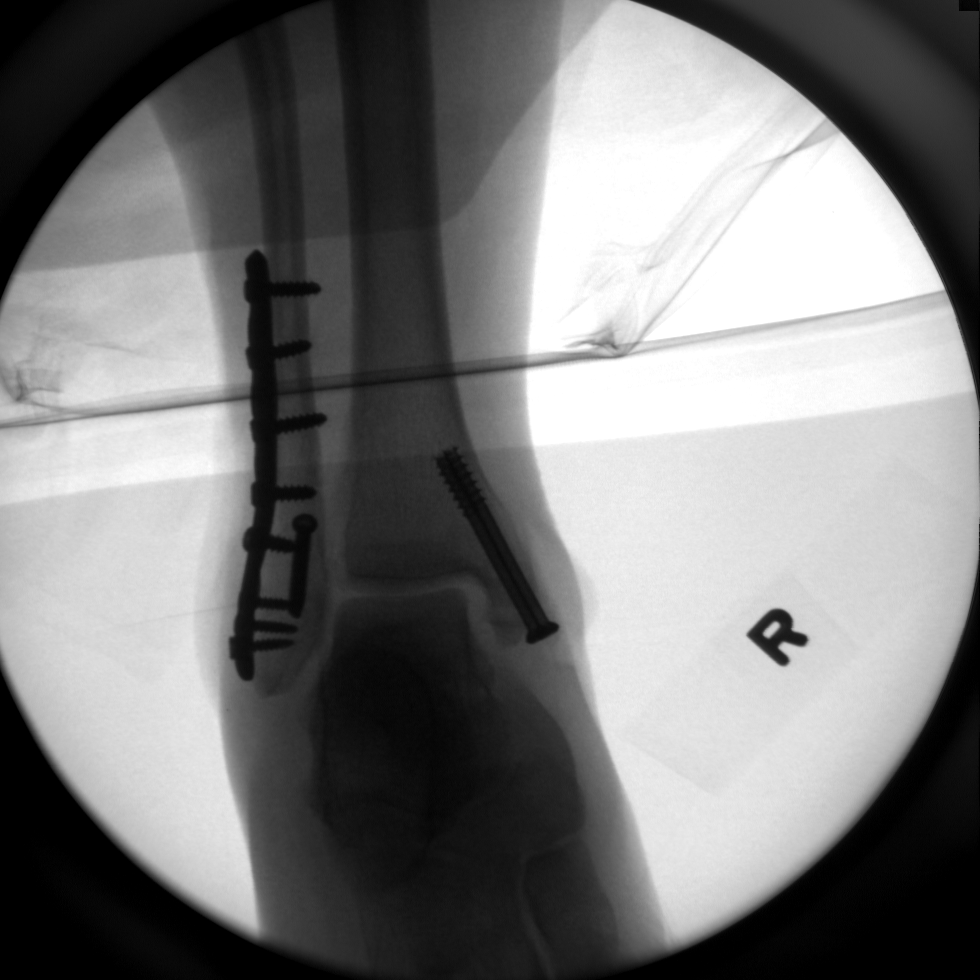
[im 3/4]
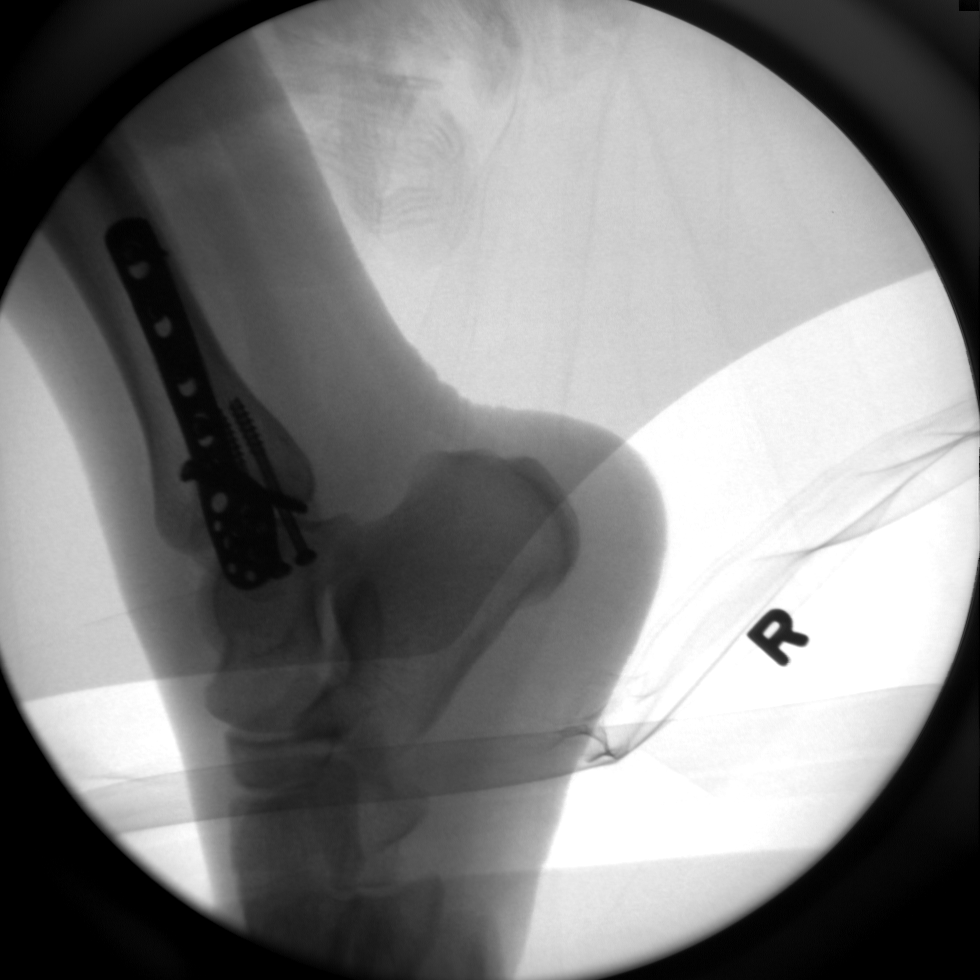
[im 4/4]
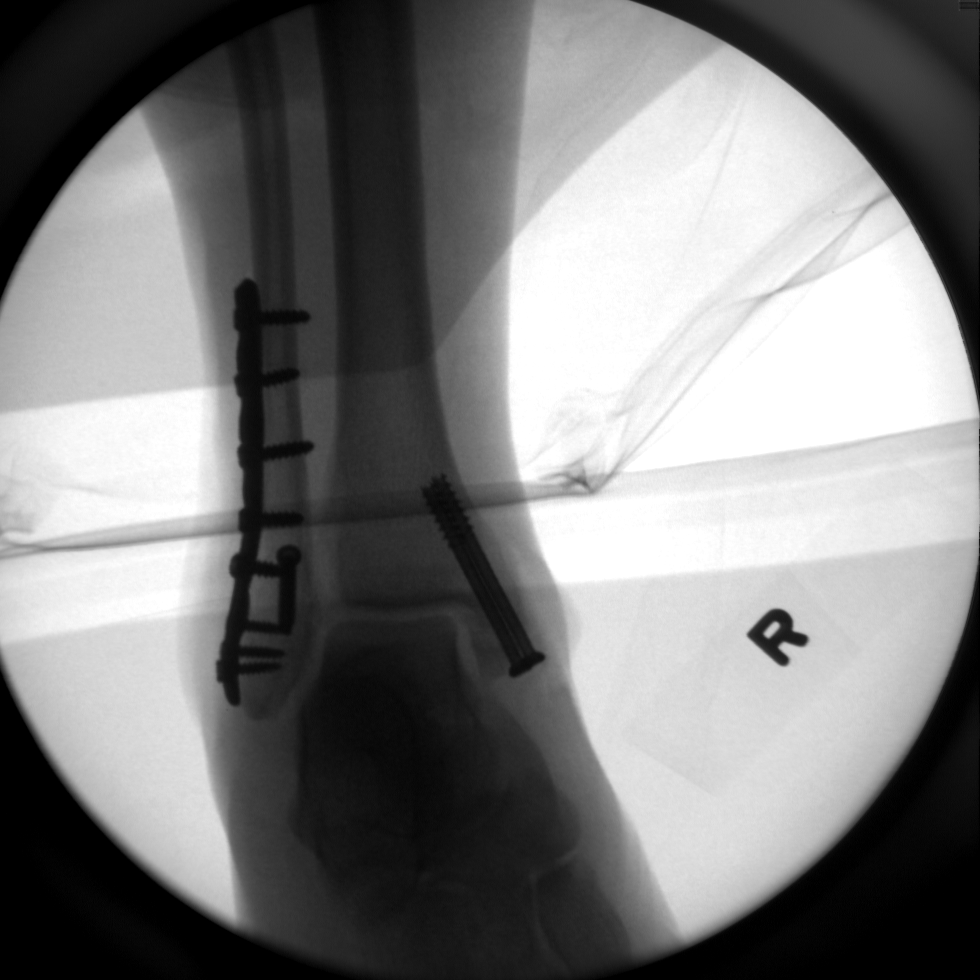

[4 of 4 positions shown; findings below may reference images not displayed]

FINDINGS: Submitted images show placement of a lateral fixation plate and
multiple screws across the distal fibular fracture common 2 screws
across the medial malleolar fracture. Fracture fragments have been
reduced into near anatomic alignment. The talus has been reduced
into normal alignment with the tibia.
IMPRESSION: Successful reduction of a displaced bimalleolar ankle fracture
following ORIF. No operative complication.

## 2017-03-24 ENCOUNTER — Encounter (HOSPITAL_COMMUNITY): Payer: Self-pay

## 2017-03-24 ENCOUNTER — Emergency Department (HOSPITAL_COMMUNITY): Payer: Medicaid Other

## 2017-03-24 ENCOUNTER — Emergency Department (HOSPITAL_COMMUNITY)
Admission: EM | Admit: 2017-03-24 | Discharge: 2017-03-24 | Disposition: A | Discharge status: Home / Self Care | Payer: Medicaid Other | Attending: Emergency Medicine | Admitting: Emergency Medicine

## 2017-03-24 DIAGNOSIS — F17210 Nicotine dependence, cigarettes, uncomplicated: Secondary | ICD-10-CM | POA: Insufficient documentation

## 2017-03-24 DIAGNOSIS — M25462 Effusion, left knee: Secondary | ICD-10-CM | POA: Insufficient documentation

## 2017-03-24 DIAGNOSIS — M25562 Pain in left knee: Secondary | ICD-10-CM | POA: Insufficient documentation

## 2017-03-24 DIAGNOSIS — Z7982 Long term (current) use of aspirin: Secondary | ICD-10-CM | POA: Insufficient documentation

## 2017-03-24 DIAGNOSIS — Z79891 Long term (current) use of opiate analgesic: Secondary | ICD-10-CM | POA: Insufficient documentation

## 2017-03-24 MED ORDER — DICLOFENAC SODIUM 75 MG PO TBEC: 75.0000 mg | DELAYED_RELEASE_TABLET | Freq: Two times a day (BID) | ORAL | 0 refills | Status: DC

## 2017-03-24 NOTE — ED Notes (Signed)
Patient transported to X-ray 

## 2017-03-24 NOTE — ED Triage Notes (Signed)
Per Pt, Pt reports left knee pain that started yesterday. Reports slight swelling in the knee with no distal swelling. Denies injury. 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

## 2017-03-24 NOTE — ED Provider Notes (Signed)
[facility]-EMERGENCY DEPT Provider Note    By signing my name below, I, Earmon PhoenixJennifer Waddell, attest that this documentation has been prepared under the direction and in the presence of Langston MaskerKaren Sherika Kubicki, [place]. Electronically Signed: Earmon PhoenixJennifer Waddell, ED Scribe. 03/24/17. 12:33 PM.    History   Chief Complaint Chief Complaint  Patient presents with  . Knee Pain   The history is provided by the patient and medical records. No language interpreter was used.    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 She reports associated swelling of the knee. She rates the pain at 9/10 and describes it as aching. She has taken Tylenol for pain with no significant relief. Walking, flexion and extension increases the pain. She denies alleviating factors. She denies numbness, tingling or weakness of the LLE, bruising or wounds. She denies any trauma, injury or fall.   Past Medical History:  Diagnosis Date  . Ankle fracture, bimalleolar, closed 08/30/2014  . Smoker     Patient Active Problem List   Diagnosis Date Noted  . Bimalleolar ankle fracture 09/06/2014    Past Surgical History:  Procedure Laterality Date  . NO PAST SURGERIES    . ORIF ANKLE FRACTURE Right 09/06/2014   Procedure: OPEN REDUCTION INTERNAL FIXATION (ORIF) BIMALLEOLAR ANKLE FRACTURE;  Surgeon: Eldred MangesMark Webster Yates, MD;  Location: [facility] OR;  Service: Orthopedics;  Laterality: Right;    OB History    No data available       Home Medications    Prior to Admission medications   Medication Sig Start Date End Date Taking? Authorizing Provider  acetaminophen (TYLENOL) 500 MG tablet Take 1,000 mg by mouth daily as needed for mild pain or moderate pain.    [provider]  aspirin EC 325 MG tablet Take 1 tablet (325 mg total) by mouth daily. 09/06/14   Maud DeedVernon, Sheila, PA-Web[place]  methocarbamol (ROBAXIN) 500 MG tablet Take 1 tablet (500 mg total) by mouth every  6 (six) hours as needed for muscle spasms (spasm). 09/06/14   Maud DeedVernon, Sheila, PA-[place]ster  oxyCODONE-acetaminophen (PERCOCET) 5-325 MG per tablet Take 1-2 tablets by mouth every 6 (six) hours as needed for severe pain. 09/01/14   Hess, Nada Boozerobyn M, PA-Webster  oxyCODONE-acetaminophen (ROXICET) 5-325 MG per tablet Take 1-2 tablets by mouth every 4 (four) hours as needed for severe pain. 09/06/14   Maud DeedVernon, Sheila, PA-Web[place]    Family History No family history on file.  Social History Social History  Substance Use Topics  . Smoking status: Current Every Day Smoker    Packs/day: 1.00    Years: 20.00    Types: Cigarettes  . Smokeless tobacco: Never Used  . Alcohol use No     Allergies   Patient has no known allergies.   Review of Systems Review of Systems  Musculoskeletal: Positive for arthralgias and joint swelling.  Skin: Negative for color change and wound.  Neurological: Negative for weakness and numbness.     Physical Exam Updated Vital Signs BP (!) 159/102 (BP Location: Left Arm)   Pulse 69   Temp 98.1 F (36.7 Webster) (Oral)   Resp 18   Ht 5\' 3"  (1.6 m)   Wt 136 lb (61.7 kg)   SpO2 100%   BMI 24.09 kg/m   Physical Exam  Constitutional: She is oriented to person, place, and time. She appears well-developed and well-nourished.  HENT:  Head: Normocephalic and atraumatic.  Neck: Normal  range of motion.  Cardiovascular: Normal rate and intact distal pulses.   Pulmonary/Chest: Effort normal.  Musculoskeletal: Normal range of motion. She exhibits edema and tenderness. She exhibits no deformity.  Full ROM of left knee. Moderate effusion noted. TTP to lateral patellar facet. Negative valgus/varus stress test. Negative Lachman's test.  Neurological: She is alert and oriented to person, place, and time.  Skin: Skin is warm and dry.  Psychiatric: She has a normal mood and affect. Her behavior is normal.  Nursing note and vitals reviewed.    ED Treatments / Results  DIAGNOSTIC  STUDIES: Oxygen Saturation is 100% on RA, normal by my interpretation.   COORDINATION OF CARE: 12:33 PM- Will X-Ray left knee. Pt verbalizes understanding and agrees to plan.  Medications - No data to display   Labs (all labs ordered are listed, but only abnormal results are displayed) Labs Reviewed - No data to display  EKG  EKG Interpretation None       Radiology No results found.  Procedures Procedures (including critical care time)  Medications Ordered in ED Medications - No data to display   Initial Impression / Assessment and Plan / ED Course  I have reviewed the triage vital signs and the nursing notes.  Pertinent labs & imaging results that were available during my care of the patient were reviewed by me and considered in my medical decision making (see chart for details).       Final Clinical Impressions(s) / ED Diagnoses   Final diagnoses:  Effusion of left knee  Left knee pain, unspecified chronicity    New Prescriptions New Prescriptions   DICLOFENAC (VOLTAREN) 75 MG EC TABLET    Take 1 tablet (75 mg total) by mouth 2 (two) times daily.   Knee sleeve An After Visit Summary was printed and given to the patient.  I personally performed the services in this documentation, which was scribed in my presence.  The recorded information has been reviewed and considered.   Barnet Pall.    Elson Areas, PA-[place] 03/24/17 1507    Tilden Fossa, MD 03/25/17 [phone]

## 2018-06-27 ENCOUNTER — Emergency Department (HOSPITAL_COMMUNITY)
Admission: EM | Admit: 2018-06-27 | Discharge: 2018-06-27 | Disposition: A | Discharge status: Home / Self Care | Payer: Self-pay | Attending: Emergency Medicine | Admitting: Emergency Medicine

## 2018-06-27 ENCOUNTER — Encounter (HOSPITAL_COMMUNITY): Payer: Self-pay | Admitting: *Deleted

## 2018-06-27 DIAGNOSIS — F17210 Nicotine dependence, cigarettes, uncomplicated: Secondary | ICD-10-CM | POA: Insufficient documentation

## 2018-06-27 DIAGNOSIS — Z7982 Long term (current) use of aspirin: Secondary | ICD-10-CM | POA: Insufficient documentation

## 2018-06-27 DIAGNOSIS — H1032 Unspecified acute conjunctivitis, left eye: Secondary | ICD-10-CM | POA: Insufficient documentation

## 2018-06-27 DIAGNOSIS — Z79899 Other long term (current) drug therapy: Secondary | ICD-10-CM | POA: Insufficient documentation

## 2018-06-27 DIAGNOSIS — H109 Unspecified conjunctivitis: Secondary | ICD-10-CM

## 2018-06-27 MED ORDER — POLYMYXIN B-TRIMETHOPRIM 10000-0.1 UNIT/ML-% OP SOLN: 1.0000 [drp] | OPHTHALMIC | 0 refills | Status: DC

## 2018-06-27 NOTE — ED Triage Notes (Signed)
Pt in c/o left eye redness and drainage for the last few days

## 2018-06-27 NOTE — Discharge Instructions (Signed)
Use eyedrops as prescribed.  Do not touch the dropper to your eyes.  Wash your hands after using eyedrops. If the cause is bacterial, the eyedrops will help.  If the cause is allergic or viral, it will take several days before your symptoms improve.  Try your best not to touch or irritate her eyes, as this could spread the infection and make symptoms worse. Follow-up with urgent care for reevaluation of symptoms are just not improving.   Return to the ER if you develop vision changes, fevers, severe/constant pain behind your eye, inability to move your eye, or any new or concerning symptoms

## 2018-06-27 NOTE — ED Provider Notes (Signed)
[facility] EMERGENCY DEPARTMENT Provider Note   CSN: [id number] Arrival date & time: 06/27/18  1027     History   Chief Complaint Chief Complaint  Patient presents with  . Eye Drainage    HPI Crystal Webster is a 42 y.o. female resenting for evaluation of left eye redness and drainage.  Patient states she developed left eye irritation yesterday.  She reports it is red, itchy, and feels irritated.  She is having white drainage coming from her eye, high risk crusted shut when she wakes up in the morning.  She recently took a bus to and from [place], return 3 days ago.  She denies sick contacts.  She denies fevers, chills, ear pain, nasal congestion, sore throat, cough.  She has not been taking anything for her symptoms.  She denies vision changes or pain behind her eye.  She denies difficulty moving her eyes.  HPI  Past Medical History:  Diagnosis Date  . Ankle fracture, bimalleolar, closed 08/30/2014  . Smoker     Patient Active Problem List   Diagnosis Date Noted  . Bimalleolar ankle fracture 09/06/2014    Past Surgical History:  Procedure Laterality Date  . NO PAST SURGERIES    . ORIF ANKLE FRACTURE Right 09/06/2014   Procedure: OPEN REDUCTION INTERNAL FIXATION (ORIF) BIMALLEOLAR ANKLE FRACTURE;  Surgeon: Eldred Manges, MD;  Location: [facility] OR;  Service: Orthopedics;  Laterality: Right;     OB History   None      Home Medications    Prior to Admission medications   Medication Sig Start Date End Date Taking? Authorizing Provider  acetaminophen (TYLENOL) 500 MG tablet Take 1,000 mg by mouth daily as needed for mild pain or moderate pain.    [provider]  aspirin EC 325 MG tablet Take 1 tablet (325 mg total) by mouth daily. 09/06/14   Maud Deed, PA-C  diclofenac (VOLTAREN) 75 MG EC tablet Take 1 tablet (75 mg total) by mouth 2 (two) times daily. 03/24/17   Elson Areas, PA-C  methocarbamol (ROBAXIN) 500 MG tablet Take 1 tablet (500  mg total) by mouth every 6 (six) hours as needed for muscle spasms (spasm). 09/06/14   Maud Deed, PA-C  oxyCODONE-acetaminophen (PERCOCET) 5-325 MG per tablet Take 1-2 tablets by mouth every 6 (six) hours as needed for severe pain. 09/01/14   Hess, Nada Boozer, PA-C  oxyCODONE-acetaminophen (ROXICET) 5-325 MG per tablet Take 1-2 tablets by mouth every 4 (four) hours as needed for severe pain. 09/06/14   Maud Deed, PA-C  trimethoprim-polymyxin b (POLYTRIM) ophthalmic solution Place 1 drop into the left eye every 4 (four) hours. 06/27/18   Maziah Smola, PA-C    Family History History reviewed. No pertinent family history.  Social History Social History   Tobacco Use  . Smoking status: Current Every Day Smoker    Packs/day: 1.00    Years: 20.00    Pack years: 20.00    Types: Cigarettes  . Smokeless tobacco: Never Used  Substance Use Topics  . Alcohol use: No  . Drug use: No     Allergies   Patient has no known allergies.   Review of Systems Review of Systems  Constitutional: Negative for fever.  Eyes: Positive for discharge, redness and itching. Negative for photophobia and visual disturbance.     Physical Exam Updated Vital Signs BP 135/86 (BP Location: Right Arm)   Pulse 67   Temp 98.2 F (36.8 C) (Oral)  Resp 18   SpO2 100%   Physical Exam  Constitutional: She is oriented to person, place, and time. She appears well-developed and well-nourished. No distress.  HENT:  Head: Normocephalic and atraumatic.  OP clear without tonsillar swelling or exudate.  Uvula midline with equal palate rise.  TMs nonerythematous and not bulging bilaterally.  Eyes: Pupils are equal, round, and reactive to light. EOM and lids are normal. Right conjunctiva is not injected. Right conjunctiva has no hemorrhage. Left conjunctiva is injected. Left conjunctiva has no hemorrhage.  Injection of the left conjunctive a without chemosis.  No bulging.  EOMI and PERRLA.   Neck: Normal  range of motion.  Cardiovascular: Normal rate, regular rhythm and intact distal pulses.  Pulmonary/Chest: Effort normal and breath sounds normal.  Abdominal: Soft. She exhibits no distension.  Musculoskeletal: Normal range of motion.  Neurological: She is alert and oriented to person, place, and time.  Skin: Skin is warm. No rash noted.  Psychiatric: She has a normal mood and affect.  Nursing note and vitals reviewed.    ED Treatments / Results  Labs (all labs ordered are listed, but only abnormal results are displayed) Labs Reviewed - No data to display  EKG None  Radiology No results found.  Procedures Procedures (including critical care time)  Medications Ordered in ED Medications - No data to display   Initial Impression / Assessment and Plan / ED Course  I have reviewed the triage vital signs and the nursing notes.  Pertinent labs & imaging results that were available during my care of the patient were reviewed by me and considered in my medical decision making (see chart for details).     Pt presenting for evaluation of left eye redness and irritation.  Physical exam consistent with conjunctivitis.  As patient is having white drainage and crusting when she wakes up in the morning, will treat for possible bacterial conjunctivitis.  Discussed treatment with eyedrops and time.  Discussed importance of not touching her eyes.  Discussed possibility of allergic vs viral cause. Patient to follow-up as needed if symptoms are not improving.  At this time, patient appears safe for discharge.  Return precautions given.  Patient states she understands and agrees plan.  Final Clinical Impressions(s) / ED Diagnoses   Final diagnoses:  Conjunctivitis of left eye, unspecified conjunctivitis type    ED Discharge Orders         Ordered    trimethoprim-polymyxin b (POLYTRIM) ophthalmic solution  Every 4 hours     06/27/18 1223           Alveria Apley, PA-C 06/27/18  1622    Vanetta Mulders, MD 07/02/18 [phone]

## 2020-05-09 ENCOUNTER — Encounter (HOSPITAL_COMMUNITY): Payer: Self-pay | Admitting: Pediatrics

## 2020-05-09 ENCOUNTER — Emergency Department (HOSPITAL_COMMUNITY): Payer: Self-pay

## 2020-05-09 ENCOUNTER — Other Ambulatory Visit: Payer: Self-pay

## 2020-05-09 ENCOUNTER — Emergency Department (HOSPITAL_COMMUNITY)
Admission: EM | Admit: 2020-05-09 | Discharge: 2020-05-09 | Disposition: A | Discharge status: Home / Self Care | Payer: Self-pay | Attending: Emergency Medicine | Admitting: Emergency Medicine

## 2020-05-09 DIAGNOSIS — Z7982 Long term (current) use of aspirin: Secondary | ICD-10-CM | POA: Insufficient documentation

## 2020-05-09 DIAGNOSIS — R05 Cough: Secondary | ICD-10-CM | POA: Insufficient documentation

## 2020-05-09 DIAGNOSIS — R059 Cough, unspecified: Secondary | ICD-10-CM

## 2020-05-09 DIAGNOSIS — Z79899 Other long term (current) drug therapy: Secondary | ICD-10-CM | POA: Insufficient documentation

## 2020-05-09 DIAGNOSIS — Z20822 Contact with and (suspected) exposure to covid-19: Secondary | ICD-10-CM

## 2020-05-09 DIAGNOSIS — F17210 Nicotine dependence, cigarettes, uncomplicated: Secondary | ICD-10-CM | POA: Insufficient documentation

## 2020-05-09 DIAGNOSIS — R030 Elevated blood-pressure reading, without diagnosis of hypertension: Secondary | ICD-10-CM

## 2020-05-09 LAB — SARS CORONAVIRUS 2 (TAT 6-24 HRS): SARS Coronavirus 2: NEGATIVE

## 2020-05-09 MED ORDER — BENZONATATE 100 MG PO CAPS: 100.0000 mg | ORAL_CAPSULE | Freq: Three times a day (TID) | ORAL | 0 refills | Status: DC

## 2020-05-09 NOTE — ED Notes (Signed)
Called pt's name in lobby 3x. No answer.

## 2020-05-09 NOTE — Discharge Instructions (Signed)
Your chest xray did not show any signs of infection. We have swabbed you for COVID 19 given you have not been vaccinated - please stay at home and self isolate awaiting your results. We will call if you are positive. You can also check mychart to see your results that way.   I have sent a prescription for cough medicine to your pharmacy - pick up and take as prescribed. Continue monitoring your symptoms at home. Take Tylenol as needed if you develop a fever > 100.4. Drink plenty of fluids to stay hydrated.   Your blood pressure was elevated today - please follow up with your PCP. If you do not have one you can follow up with [facility] for primary care needs. Please call to schedule an appointment.   Return to the ED for any worsening symptoms

## 2020-05-09 NOTE — ED Provider Notes (Signed)
[facility] EMERGENCY DEPARTMENT Provider Note   CSN: [id number] Arrival date & time: 05/09/20  1148     History Chief Complaint  Patient presents with   Cough    Crystal Webster is a 44 y.o. female who presents to the ED today with complaint of gradual onset, constant, dry cough x 3 days. Pt has been taking OTC medication without relief. States she has a grandchild at home and wanted to make sure she was okay. She denies any recent sick contact. Grandchild does not attend daycare. Pt has not been vaccinated against COVID 19. Pt denies fevers, chills, sore throat, congestion, rhinorrhea, ear pain, shortness of breath, chest pain, body aches, or any other associated symptoms.   The history is provided by the patient and medical records.       Past Medical History:  Diagnosis Date   Ankle fracture, bimalleolar, closed 08/30/2014   Smoker     Patient Active Problem List   Diagnosis Date Noted   Bimalleolar ankle fracture 09/06/2014    Past Surgical History:  Procedure Laterality Date   NO PAST SURGERIES     ORIF ANKLE FRACTURE Right 09/06/2014   Procedure: OPEN REDUCTION INTERNAL FIXATION (ORIF) BIMALLEOLAR ANKLE FRACTURE;  Surgeon: Eldred Manges, MD;  Location: [facility] OR;  Service: Orthopedics;  Laterality: Right;     OB History   No obstetric history on file.     No family history on file.  Social History   Tobacco Use   Smoking status: Current Every Day Smoker    Packs/day: 1.00    Years: 20.00    Pack years: 20.00    Types: Cigarettes   Smokeless tobacco: Never Used  Substance Use Topics   Alcohol use: No   Drug use: No    Home Medications Prior to Admission medications   Medication Sig Start Date End Date Taking? Authorizing Provider  acetaminophen (TYLENOL) 500 MG tablet Take 1,000 mg by mouth daily as needed for mild pain or moderate pain.    [provider]  aspirin EC 325 MG tablet Take 1 tablet (325 mg total) by  mouth daily. 09/06/14   Maud Deed, PA-C  benzonatate (TESSALON) 100 MG capsule Take 1 capsule (100 mg total) by mouth every 8 (eight) hours. 05/09/20   Tanda Rockers, PA-C  diclofenac (VOLTAREN) 75 MG EC tablet Take 1 tablet (75 mg total) by mouth 2 (two) times daily. 03/24/17   Elson Areas, PA-C  methocarbamol (ROBAXIN) 500 MG tablet Take 1 tablet (500 mg total) by mouth every 6 (six) hours as needed for muscle spasms (spasm). 09/06/14   Maud Deed, PA-C  oxyCODONE-acetaminophen (PERCOCET) 5-325 MG per tablet Take 1-2 tablets by mouth every 6 (six) hours as needed for severe pain. 09/01/14   Hess, Nada Boozer, PA-C  oxyCODONE-acetaminophen (ROXICET) 5-325 MG per tablet Take 1-2 tablets by mouth every 4 (four) hours as needed for severe pain. 09/06/14   Maud Deed, PA-C  trimethoprim-polymyxin b (POLYTRIM) ophthalmic solution Place 1 drop into the left eye every 4 (four) hours. 06/27/18   Caccavale, Sophia, PA-C    Allergies    Patient has no known allergies.  Review of Systems   Review of Systems  Constitutional: Negative for chills and fever.  HENT: Negative for congestion and sore throat.   Respiratory: Positive for cough. Negative for shortness of breath.   Cardiovascular: Negative for chest pain and leg swelling.  All other systems reviewed and are negative.  Physical Exam Updated Vital Signs BP (!) 178/113 (BP Location: Left Arm) Comment: Simultaneous filing. User may not have seen previous data. Comment (BP Location): Simultaneous filing. User may not have seen previous data.   Pulse 79 Comment: Simultaneous filing. User may not have seen previous data.   Temp 98.2 F (36.8 C) (Oral) Comment: Simultaneous filing. User may not have seen previous data. Comment (Src): Simultaneous filing. User may not have seen previous data.   Resp 14 Comment: Simultaneous filing. User may not have seen previous data.   Ht 5\' 3"  (1.6 m)    Wt 61.7 kg    SpO2 96% Comment: Simultaneous filing.  User may not have seen previous data.   BMI 24.09 kg/m   Physical Exam Vitals and nursing note reviewed.  Constitutional:      Appearance: She is not ill-appearing.  HENT:     Head: Normocephalic and atraumatic.     Mouth/Throat:     Mouth: Mucous membranes are moist.     Pharynx: No oropharyngeal exudate or posterior oropharyngeal erythema.  Eyes:     Conjunctiva/sclera: Conjunctivae normal.  Cardiovascular:     Rate and Rhythm: Normal rate and regular rhythm.     Pulses: Normal pulses.  Pulmonary:     Effort: Pulmonary effort is normal.     Breath sounds: Normal breath sounds. No wheezing, rhonchi or rales.  Skin:    General: Skin is warm and dry.     Coloration: Skin is not jaundiced.  Neurological:     Mental Status: She is alert.     ED Results / Procedures / Treatments   Labs (all labs ordered are listed, but only abnormal results are displayed) Labs Reviewed  SARS CORONAVIRUS 2 (TAT 6-24 HRS)    EKG None  Radiology DG Chest 2 View  Result Date: 05/09/2020 CLINICAL DATA:  Cough EXAM: CHEST - 2 VIEW COMPARISON:  02/17/2010 FINDINGS: The heart size and mediastinal contours are within normal limits. Both lungs are clear. The visualized skeletal structures are unremarkable. IMPRESSION: No acute abnormality of the lungs. Electronically Signed   By: 04/19/2010 M.D.   On: 05/09/2020 12:46    Procedures Procedures (including critical care time)  Medications Ordered in ED Medications - No data to display  ED Course  I have reviewed the triage vital signs and the nursing notes.  Pertinent labs & imaging results that were available during my care of the patient were reviewed by me and considered in my medical decision making (see chart for details).    MDM Rules/Calculators/A&P                          44 year old female presents to the ED with complaint of a dry cough for the past 3 days.  No improvement with over-the-counter medications.  No other symptoms.   Has not been vaccinated against COVID-19.  Presents the ED afebrile, nontachycardic nontachypneic.  She appears to be in no acute distress.  She is able to speak in full sentences without difficulty.  Very mild active cough during exam.  Lungs clear to auscultation bilaterally however and satting 96% on O2.  Of note patient's blood pressure mildly elevated 173/113, will recheck.  Patient without a history of hypertension does not appear to be on medications.  Wrist x-ray was done while patient was in the waiting room, negative for any acute findings.  Patient is a current everyday smoker and this may be contributing  to her symptoms however given she has not been vaccinated against COVID-19 will test at discharge.  Instructed to self isolate and await her results.  She is in agreement with plan.  We will plan to discharge with Tessalon Perles.   Repeat BP 159/114. Pt to follow up with PCP regarding this. Info given for [facility] for primary care needs.   Crystal Webster was evaluated in Emergency Department on 05/09/2020 for the symptoms described in the history of present illness. She was evaluated in the context of the global COVID-19 pandemic, which necessitated consideration that the patient might be at risk for infection with the SARS-CoV-2 virus that causes COVID-19. Institutional protocols and algorithms that pertain to the evaluation of patients at risk for COVID-19 are in a state of rapid change based on information released by regulatory bodies including the CDC and federal and state organizations. These policies and algorithms were followed during the patient's care in the ED.  This note was prepared using Dragon voice recognition software and may include unintentional dictation errors due to the inherent limitations of voice recognition software.  Final Clinical Impression(s) / ED Diagnoses Final diagnoses:  Cough  Person under investigation for COVID-19  Elevated blood pressure  reading    Rx / DC Orders ED Discharge Orders         Ordered    benzonatate (TESSALON) 100 MG capsule  Every 8 hours     Discontinue  Reprint     05/09/20 1625           Discharge Instructions     Your chest xray did not show any signs of infection. We have swabbed you for COVID 19 given you have not been vaccinated - please stay at home and self isolate awaiting your results. We will call if you are positive. You can also check mychart to see your results that way.   I have sent a prescription for cough medicine to your pharmacy - pick up and take as prescribed. Continue monitoring your symptoms at home. Take Tylenol as needed if you develop a fever > 100.4. Drink plenty of fluids to stay hydrated.   Your blood pressure was elevated today - please follow up with your PCP. If you do not have one you can follow up with [facility] for primary care needs. Please call to schedule an appointment.   Return to the ED for any worsening symptoms       Tanda Rockers, PA-C 05/09/20 1726    Rolan Bucco, MD 05/09/20 [phone]

## 2020-05-09 NOTE — ED Triage Notes (Signed)
C/o cough x 3 days  

## 2020-07-01 ENCOUNTER — Other Ambulatory Visit: Payer: Self-pay

## 2020-07-01 ENCOUNTER — Emergency Department (HOSPITAL_COMMUNITY)
Admission: EM | Admit: 2020-07-01 | Discharge: 2020-07-01 | Disposition: A | Discharge status: Home / Self Care | Payer: Self-pay | Attending: Emergency Medicine | Admitting: Emergency Medicine

## 2020-07-01 ENCOUNTER — Emergency Department (HOSPITAL_COMMUNITY): Payer: Self-pay

## 2020-07-01 DIAGNOSIS — F17210 Nicotine dependence, cigarettes, uncomplicated: Secondary | ICD-10-CM | POA: Insufficient documentation

## 2020-07-01 DIAGNOSIS — M7662 Achilles tendinitis, left leg: Secondary | ICD-10-CM | POA: Insufficient documentation

## 2020-07-01 MED ORDER — OXYCODONE-ACETAMINOPHEN 5-325 MG PO TABS
1.0000 | ORAL_TABLET | ORAL | Status: DC | PRN
  Administered 2020-07-01: 1 via ORAL
  Filled 2020-07-01: qty 1

## 2020-07-01 MED ORDER — IBUPROFEN 600 MG PO TABS: 600.0000 mg | ORAL_TABLET | Freq: Four times a day (QID) | ORAL | 0 refills | Status: DC | PRN

## 2020-07-01 NOTE — Discharge Instructions (Signed)
Please use Tylenol or ibuprofen for pain.  You may use 600 mg ibuprofen every 6 hours or 1000 mg of Tylenol every 6 hours.  You may choose to alternate between the 2.  This would be most effective.  Not to exceed 4 g of Tylenol within 24 hours.  Not to exceed 3200 mg ibuprofen 24 hours.  

## 2020-07-01 NOTE — ED Notes (Signed)
No answer for room x3 

## 2020-07-01 NOTE — ED Provider Notes (Signed)
[facility] EMERGENCY DEPARTMENT Provider Note   CSN: [id number] Arrival date & time: 07/01/20  1020     History Chief Complaint  Patient presents with  . Foot Pain    Crystal Webster is a 44 y.o. female.  HPI  Patient is a 44 year old female with no pertinent past medical history presented today for left heel pain that has been ongoing for 3-4 days.  She states that it is about her heel and is sharp stinging pain worse when she steps.  She states that it does not radiate anywhere.  It does not have any associated symptoms.  Does seem to be worse with the first step today she has no history of plantar fasciitis.  No history of rheumatologic disease.  Denies any calf swelling or leg swelling or foot swelling.  Denies any shortness of breath or chest pain. No medications other than Epson salt soaks.     Past Medical History:  Diagnosis Date  . Ankle fracture, bimalleolar, closed 08/30/2014  . Smoker     Patient Active Problem List   Diagnosis Date Noted  . Bimalleolar ankle fracture 09/06/2014    Past Surgical History:  Procedure Laterality Date  . NO PAST SURGERIES    . ORIF ANKLE FRACTURE Right 09/06/2014   Procedure: OPEN REDUCTION INTERNAL FIXATION (ORIF) BIMALLEOLAR ANKLE FRACTURE;  Surgeon: Eldred Manges, MD;  Location: [facility] OR;  Service: Orthopedics;  Laterality: Right;     OB History   No obstetric history on file.     No family history on file.  Social History   Tobacco Use  . Smoking status: Current Every Day Smoker    Packs/day: 1.00    Years: 20.00    Pack years: 20.00    Types: Cigarettes  . Smokeless tobacco: Never Used  Substance Use Topics  . Alcohol use: No  . Drug use: No    Home Medications Prior to Admission medications   Medication Sig Start Date End Date Taking? Authorizing Provider  acetaminophen (TYLENOL) 500 MG tablet Take 1,000 mg by mouth daily as needed for mild pain or moderate pain.    [provider]    aspirin EC 325 MG tablet Take 1 tablet (325 mg total) by mouth daily. 09/06/14   Maud Deed, PA-C  benzonatate (TESSALON) 100 MG capsule Take 1 capsule (100 mg total) by mouth every 8 (eight) hours. 05/09/20   Tanda Rockers, PA-C  diclofenac (VOLTAREN) 75 MG EC tablet Take 1 tablet (75 mg total) by mouth 2 (two) times daily. 03/24/17   Elson Areas, PA-C  ibuprofen (ADVIL) 600 MG tablet Take 1 tablet (600 mg total) by mouth every 6 (six) hours as needed. 07/01/20   Gailen Shelter, PA  methocarbamol (ROBAXIN) 500 MG tablet Take 1 tablet (500 mg total) by mouth every 6 (six) hours as needed for muscle spasms (spasm). 09/06/14   Maud Deed, PA-C  oxyCODONE-acetaminophen (PERCOCET) 5-325 MG per tablet Take 1-2 tablets by mouth every 6 (six) hours as needed for severe pain. 09/01/14   Hess, Nada Boozer, PA-C  oxyCODONE-acetaminophen (ROXICET) 5-325 MG per tablet Take 1-2 tablets by mouth every 4 (four) hours as needed for severe pain. 09/06/14   Maud Deed, PA-C  trimethoprim-polymyxin b (POLYTRIM) ophthalmic solution Place 1 drop into the left eye every 4 (four) hours. 06/27/18   Caccavale, Sophia, PA-C    Allergies    Patient has no known allergies.  Review of Systems   Review of  Systems  Constitutional: Negative for fever.  HENT: Negative for congestion.   Respiratory: Negative for shortness of breath.   Cardiovascular: Negative for chest pain.  Gastrointestinal: Negative for abdominal distention.  Musculoskeletal:       Left heel pain  Neurological: Negative for dizziness and headaches.    Physical Exam Updated Vital Signs BP (!) 159/103   Temp 98.8 F (37.1 C) (Oral)   Resp 16   Ht 5\' 3"  (1.6 m)   Wt 61.7 kg   LMP 06/01/2020 (Within Days)   SpO2 99%   BMI 24.09 kg/m   Physical Exam Vitals and nursing note reviewed.  Constitutional:      General: She is not in acute distress.    Appearance: Normal appearance. She is not ill-appearing.  HENT:     Head:  Normocephalic and atraumatic.  Eyes:     General: No scleral icterus.       Right eye: No discharge.        Left eye: No discharge.     Conjunctiva/sclera: Conjunctivae normal.  Pulmonary:     Effort: Pulmonary effort is normal.     Breath sounds: No stridor.  Musculoskeletal:     Comments: Mild tenderness to palpation of the left heel.  Negative Thompson test.  No calf or foot swelling. No bruising step-off or deformity.  Ankle without any medial lateral or posterior tenderness to palpation.  Full range of motion of ankle and all toes.  Neurological:     Mental Status: She is alert and oriented to person, place, and time. Mental status is at baseline.     ED Results / Procedures / Treatments   Labs (all labs ordered are listed, but only abnormal results are displayed) Labs Reviewed - No data to display  EKG None  Radiology DG Ankle Complete Left  Result Date: 07/01/2020 CLINICAL DATA:  Pain, LEFT-sided calcaneal pain no history of injury EXAM: LEFT ANKLE COMPLETE - 3+ VIEW COMPARISON:  None. FINDINGS: There is no evidence of fracture, dislocation, or joint effusion. There is no evidence of arthropathy or other focal bone abnormality. Soft tissues are unremarkable. IMPRESSION: Negative evaluation of the LEFT ankle Electronically Signed   By: 07/03/2020 M.D.   On: 07/01/2020 11:12    Procedures Procedures (including critical care time)  Medications Ordered in ED Medications  oxyCODONE-acetaminophen (PERCOCET/ROXICET) 5-325 MG per tablet 1 tablet (1 tablet Oral Given 07/01/20 1038)    ED Course  I have reviewed the triage vital signs and the nursing notes.  Pertinent labs & imaging results that were available during my care of the patient were reviewed by me and considered in my medical decision making (see chart for details).    MDM Rules/Calculators/A&P                          Patient is a 44 year old female with no pertinent past medical history presented today  with left heel pain for several days.  History is consistent with Achilles tendinitis versus plantar fasciitis.  Physical exam notable for heel tenderness to palpation.  Patient is ambulatory.  X-ray reviewed by myself shows no acute fracture.  There is no bone spur in the heel.  I agree of radiology read.  Suspect Achilles tendinitis.  Will prescribe NSAIDs rest ice elevate and follow-up with PCP.  She may also follow-up with orthopedics.  I have provided her with a phone number for this.  She has no history  of GI bleeds or cardiac disease.  Ibuprofen will be a good choice for her.  She is understanding of plan and agreeable to discharge at this time.    Final Clinical Impression(s) / ED Diagnoses Final diagnoses:  Left Achilles tendinitis    Rx / DC Orders ED Discharge Orders         Ordered    ibuprofen (ADVIL) 600 MG tablet  Every 6 hours PRN        07/01/20 1329           Solon Augusta [place], [place] 07/01/20 1332    Benjiman Core, MD 07/01/20 1513

## 2020-07-01 NOTE — ED Notes (Signed)
Pt came to sort, pt taken back to treatment area

## 2020-07-01 NOTE — ED Triage Notes (Signed)
Onset 3-4 days ago developed pain bottom of left heel. States pain worsening while ambulating.

## 2020-11-28 ENCOUNTER — Encounter (HOSPITAL_COMMUNITY): Payer: Self-pay | Admitting: Emergency Medicine

## 2020-11-28 ENCOUNTER — Emergency Department (HOSPITAL_COMMUNITY)
Admission: EM | Admit: 2020-11-28 | Discharge: 2020-11-28 | Disposition: A | Discharge status: Home / Self Care | Payer: Self-pay | Attending: Emergency Medicine | Admitting: Emergency Medicine

## 2020-11-28 ENCOUNTER — Emergency Department (HOSPITAL_COMMUNITY): Payer: Self-pay

## 2020-11-28 DIAGNOSIS — F17210 Nicotine dependence, cigarettes, uncomplicated: Secondary | ICD-10-CM | POA: Insufficient documentation

## 2020-11-28 DIAGNOSIS — S8392XA Sprain of unspecified site of left knee, initial encounter: Secondary | ICD-10-CM | POA: Insufficient documentation

## 2020-11-28 DIAGNOSIS — Y990 Civilian activity done for income or pay: Secondary | ICD-10-CM | POA: Insufficient documentation

## 2020-11-28 DIAGNOSIS — W010XXA Fall on same level from slipping, tripping and stumbling without subsequent striking against object, initial encounter: Secondary | ICD-10-CM | POA: Insufficient documentation

## 2020-11-28 DIAGNOSIS — Z7982 Long term (current) use of aspirin: Secondary | ICD-10-CM | POA: Insufficient documentation

## 2020-11-28 DIAGNOSIS — Y9259 Other trade areas as the place of occurrence of the external cause: Secondary | ICD-10-CM | POA: Insufficient documentation

## 2020-11-28 NOTE — ED Notes (Signed)
Knee immobilizer applied and crutches provided by [employer]. Pt requesting bus pass. Called case management to supply.

## 2020-11-28 NOTE — ED Notes (Signed)
Patient is resting comfortably in chair. No needs identified at this time.

## 2020-11-28 NOTE — ED Notes (Signed)
Returned to pt room to provide bus pass. Pt left.

## 2020-11-28 NOTE — Discharge Instructions (Signed)
You can take Tylenol or Ibuprofen as directed for pain. You can alternate Tylenol and Ibuprofen every 4 hours. If you take Tylenol at 1pm, then you can take Ibuprofen at 5pm. Then you can take Tylenol again at 9pm.  ° °Follow the RICE (Rest, Ice, Compression, Elevation) protocol as directed.  ° °Wear the knee immobilizer for support and stabilization. ° ° Follow-up with the referred orthopedic doctor if your symptoms do not improve in on week.  ° °Return to the Emergency Department for any worsening pain, fever, redness/swelling of the knee, numbness/weakness of the extremity.  °

## 2020-11-28 NOTE — ED Notes (Signed)
Discharge information reviewed with patient. Awaiting ortho tech to apply knee immobilizer and provide crutches.

## 2020-11-28 NOTE — ED Provider Notes (Signed)
[facility] EMERGENCY DEPARTMENT Provider Note   CSN: [id number] Arrival date & time:        History Chief Complaint  Patient presents with  . Fall    Crystal Webster is a 45 y.o. female presents for evaluation of left knee pain after mechanical fall that occurred this morning.  Patient reports she was at work and states she tripped coming out of an [employer].  She reports that she does not exert exactly sure how she landed.  She states her knee was bent backwards.  She reports that since then, she has had pain, difficulty ambulating on the leg.  She reports some associated mild soft tissue swelling.  She had does not have any numbness/weakness.  The history is provided by the patient.       Past Medical History:  Diagnosis Date  . Ankle fracture, bimalleolar, closed 08/30/2014  . Smoker     Patient Active Problem List   Diagnosis Date Noted  . Bimalleolar ankle fracture 09/06/2014    Past Surgical History:  Procedure Laterality Date  . NO PAST SURGERIES    . ORIF ANKLE FRACTURE Right 09/06/2014   Procedure: OPEN REDUCTION INTERNAL FIXATION (ORIF) BIMALLEOLAR ANKLE FRACTURE;  Surgeon: Eldred Manges, MD;  Location: [facility] OR;  Service: Orthopedics;  Laterality: Right;     OB History   No obstetric history on file.     No family history on file.  Social History   Tobacco Use  . Smoking status: Current Every Day Smoker    Packs/day: 1.00    Years: 20.00    Pack years: 20.00    Types: Cigarettes  . Smokeless tobacco: Never Used  Substance Use Topics  . Alcohol use: No  . Drug use: No    Home Medications Prior to Admission medications   Medication Sig Start Date End Date Taking? Authorizing Provider  acetaminophen (TYLENOL) 500 MG tablet Take 1,000 mg by mouth daily as needed for mild pain or moderate pain.    [provider]  aspirin EC 325 MG tablet Take 1 tablet (325 mg total) by mouth daily. 09/06/14   Maud Deed, PA-C   benzonatate (TESSALON) 100 MG capsule Take 1 capsule (100 mg total) by mouth every 8 (eight) hours. 05/09/20   Tanda Rockers, PA-C  diclofenac (VOLTAREN) 75 MG EC tablet Take 1 tablet (75 mg total) by mouth 2 (two) times daily. 03/24/17   Elson Areas, PA-C  ibuprofen (ADVIL) 600 MG tablet Take 1 tablet (600 mg total) by mouth every 6 (six) hours as needed. 07/01/20   Gailen Shelter, PA  methocarbamol (ROBAXIN) 500 MG tablet Take 1 tablet (500 mg total) by mouth every 6 (six) hours as needed for muscle spasms (spasm). 09/06/14   Maud Deed, PA-C  oxyCODONE-acetaminophen (PERCOCET) 5-325 MG per tablet Take 1-2 tablets by mouth every 6 (six) hours as needed for severe pain. 09/01/14   Hess, Nada Boozer, PA-C  oxyCODONE-acetaminophen (ROXICET) 5-325 MG per tablet Take 1-2 tablets by mouth every 4 (four) hours as needed for severe pain. 09/06/14   Maud Deed, PA-C  trimethoprim-polymyxin b (POLYTRIM) ophthalmic solution Place 1 drop into the left eye every 4 (four) hours. 06/27/18   Caccavale, Sophia, PA-C    Allergies    Patient has no known allergies.  Review of Systems   Review of Systems  Musculoskeletal:       Left knee pain  Neurological: Negative for weakness and numbness.  All other  systems reviewed and are negative.   Physical Exam Updated Vital Signs BP (!) 174/118 (BP Location: Right Arm)   Pulse 81   Temp 99.4 F (37.4 C)   Resp 16   SpO2 100%   Physical Exam Vitals and nursing note reviewed.  Constitutional:      Appearance: She is well-developed and well-nourished.  HENT:     Head: Normocephalic and atraumatic.  Eyes:     General: No scleral icterus.       Right eye: No discharge.        Left eye: No discharge.     Extraocular Movements: EOM normal.     Conjunctiva/sclera: Conjunctivae normal.  Cardiovascular:     Pulses:          Dorsalis pedis pulses are 2+ on the right side and 2+ on the left side.  Pulmonary:     Effort: Pulmonary effort is normal.   Musculoskeletal:     Comments: Tenderness palpation noted medial anterior aspect of the left knee.  There is some mild overlying soft tissue swelling.  No deformity or crepitus noted.  Pain with flexion/tension of the knee.  Negative anterior and posterior drawer test.  No instability noted with varus or valgus stress.  No bony tenderness noted to left femur, left hip, left tib-fib, left ankle.  Skin:    General: Skin is warm and dry.     Comments: Good distal cap refill. LLE is not dusky in appearance or cool to touch.  Neurological:     Mental Status: She is alert.  Psychiatric:        Mood and Affect: Mood and affect normal.        Speech: Speech normal.        Behavior: Behavior normal.     ED Results / Procedures / Treatments   Labs (all labs ordered are listed, but only abnormal results are displayed) Labs Reviewed - No data to display  EKG None  Radiology DG Knee Complete 4 Views Left  Result Date: 11/28/2020 CLINICAL DATA:  Recent fall with left knee pain, initial encounter EXAM: LEFT KNEE - COMPLETE 4+ VIEW COMPARISON:  03/24/2017 FINDINGS: No evidence of fracture, dislocation, or joint effusion. No evidence of arthropathy or other focal bone abnormality. Soft tissues are unremarkable. IMPRESSION: No acute abnormality noted. Electronically Signed   By: Alcide Clever M.D.   On: 11/28/2020 11:21    Procedures Procedures   Medications Ordered in ED Medications - No data to display  ED Course  I have reviewed the triage vital signs and the nursing notes.  Pertinent labs & imaging results that were available during my care of the patient were reviewed by me and considered in my medical decision making (see chart for details).    MDM Rules/Calculators/A&P                          45 year old female who presents for evaluation of left knee pain after mechanical fall that occurred earlier today.  She reports difficulty ambulating bearing weight secondary to pain.  On  initial arrival, she is afebrile, nontoxic-appearing.  Vital signs are stable.  On exam, patient has tenderness palpation noted medial and anterior aspect of the knee.  Concern for fracture versus sprain.  X-rays ordered at triage.    X-rays reviewed.  No acute abnormality noted.  Discussed results with patient.  Discussed with patient that there could still be a musculoskeletal injury versus  ligamentous injury that could be causing her symptoms.  We will plan to treat with knee immobilizer, crutches. Plan to provide outpatient orthopedic referral for further follow-up and evaluation Patient had ample opportunity for questions and discussion. All patient's questions were answered with full understanding. Strict return precautions discussed. Patient expresses understanding and agreement to plan.   Portions of this note were generated with Scientist, clinical (histocompatibility and immunogenetics). Dictation errors may occur despite best attempts at proofreading.  Final Clinical Impression(s) / ED Diagnoses Final diagnoses:  Sprain of left knee, unspecified ligament, initial encounter    Rx / DC Orders ED Discharge Orders    None       Rosana Hoes 11/28/20 1348    Terald Sleeper, MD 11/28/20 1836

## 2020-11-28 NOTE — ED Triage Notes (Signed)
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 Pain when moving or attempting to bear weight on left knee. Denies LOC, denies other complaints.

## 2020-11-28 NOTE — Progress Notes (Signed)
Orthopedic Tech Progress Note Patient Details:  DWANA GARIN [birth year] [id number]  Ortho Devices Type of Ortho Device: Knee Immobilizer,Crutches Ortho Device/Splint Location: LLE Ortho Device/Splint Interventions: Ordered,Application,Adjustment   Post Interventions Patient Tolerated: Well,Ambulated well Instructions Provided: Poper ambulation with device,Care of device   Donald Pore 11/28/2020, 2:52 PM

## 2021-03-14 ENCOUNTER — Emergency Department (HOSPITAL_COMMUNITY): Payer: Self-pay

## 2021-03-14 ENCOUNTER — Other Ambulatory Visit: Payer: Self-pay

## 2021-03-14 ENCOUNTER — Emergency Department (HOSPITAL_COMMUNITY)
Admission: EM | Admit: 2021-03-14 | Discharge: 2021-03-14 | Discharge status: Against Medical Advice | Payer: Self-pay | Attending: Emergency Medicine | Admitting: Emergency Medicine

## 2021-03-14 DIAGNOSIS — M79605 Pain in left leg: Secondary | ICD-10-CM | POA: Insufficient documentation

## 2021-03-14 DIAGNOSIS — Y990 Civilian activity done for income or pay: Secondary | ICD-10-CM | POA: Insufficient documentation

## 2021-03-14 DIAGNOSIS — Z5321 Procedure and treatment not carried out due to patient leaving prior to being seen by health care provider: Secondary | ICD-10-CM | POA: Insufficient documentation

## 2021-03-14 DIAGNOSIS — W19XXXA Unspecified fall, initial encounter: Secondary | ICD-10-CM | POA: Insufficient documentation

## 2021-03-14 DIAGNOSIS — M25552 Pain in left hip: Secondary | ICD-10-CM | POA: Insufficient documentation

## 2021-03-14 NOTE — ED Triage Notes (Signed)
Patient bib gems, patient fell at work, landed on left side. Has left sided hip and leg pain. Pain rated 9/10.

## 2021-03-14 NOTE — ED Provider Notes (Signed)
Emergency Medicine Provider Triage Evaluation Note  Crystal Webster , a 45 y.o. female  was evaluated in triage.  Pt complains of a fall.  Patient states she works in a [employer] and slipped while at work just prior to arrival landing on her left hip.  She reports nonradiating pain to her left lateral hip.  Pain worsens with movement of the left leg.  No head trauma or LOC.  Does not take medications for hypertension.  No chest pain, shortness of breath, numbness, or weakness.  Physical Exam  BP (!) 226/104   Pulse 93   Temp 97.7 F (36.5 C) (Oral)   Resp 18   Ht 5\' 4"  (1.626 m)   SpO2 100%   BMI 23.34 kg/m  Gen:   Awake, no distress   Resp:  Normal effort  MSK:   Moderate TTP to the left lateral hip.  Unable to assess range of motion due to patient's pain.  Distal sensation intact.  2+ DP pulses.  No pain noted in the abdomen or distal to the left hip.  Full range of motion of the left knee and ankle. Other:    Medical Decision Making  Medically screening exam initiated at 9:31 AM.  Appropriate orders placed.  Crystal Webster was informed that the remainder of the evaluation will be completed by another provider, this initial triage assessment does not replace that evaluation, and the importance of remaining in the ED until their evaluation is complete.  Will obtain x-rays of the left hip and pelvis.  Patient declined pain medications.   Corky Sox, PA-C 03/14/21 03/16/21    [id number], MD 03/14/21 1030

## 2021-10-24 IMAGING — DX DG ANKLE COMPLETE 3+V*L*
3 series · 3 of 3 positions shown · non-contrast
Comparison: None.

CLINICAL DATA: Pain, LEFT-sided calcaneal pain no history of injury

EXAM:
LEFT ANKLE COMPLETE - 3+ VIEW

[x ankle ap left]
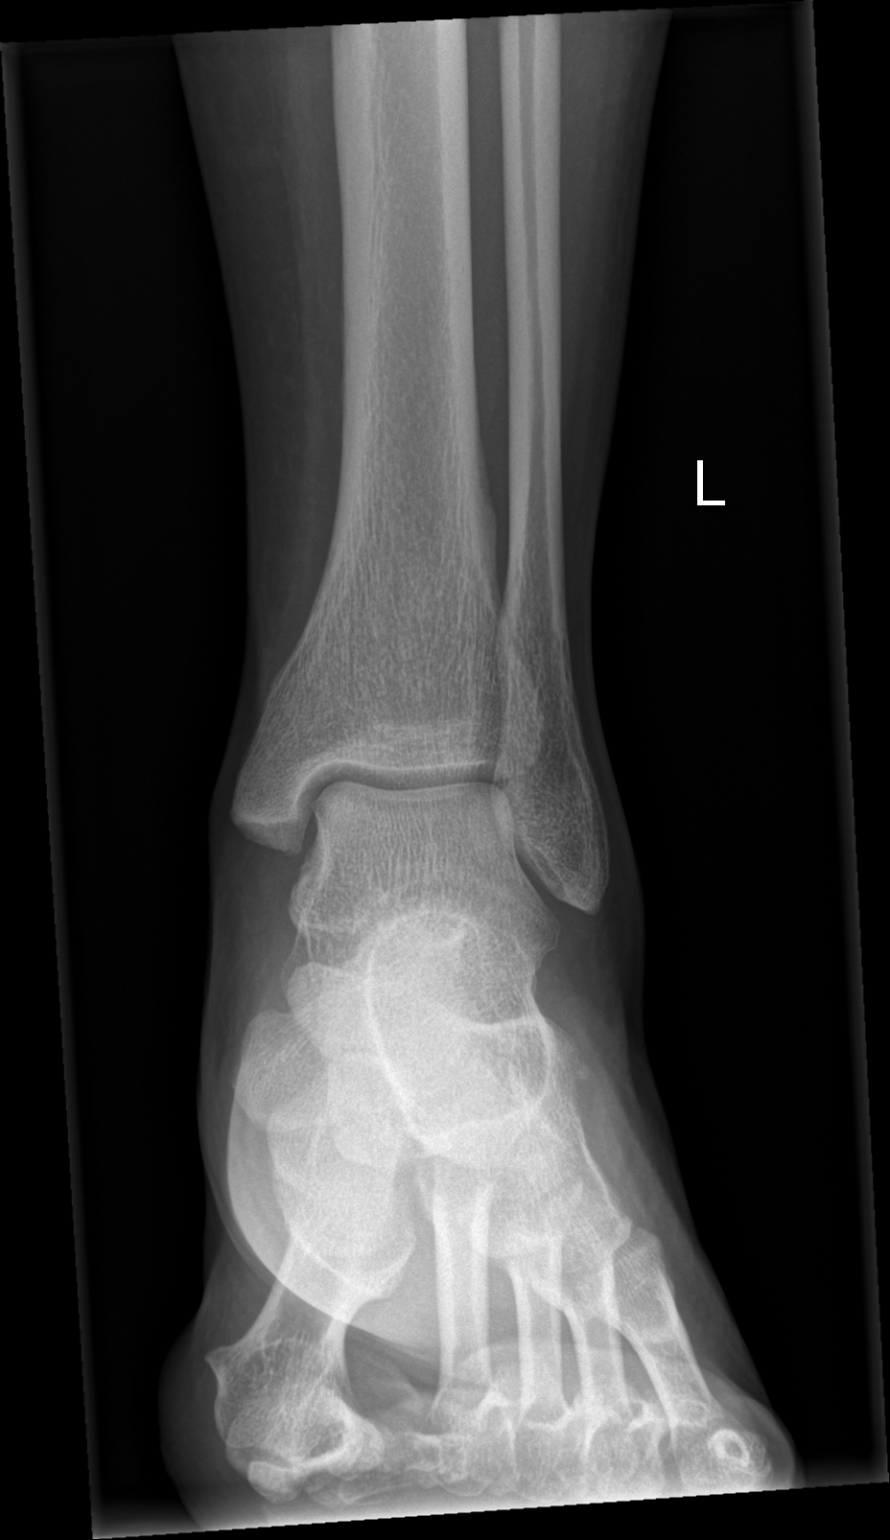

[x ankle obl left]
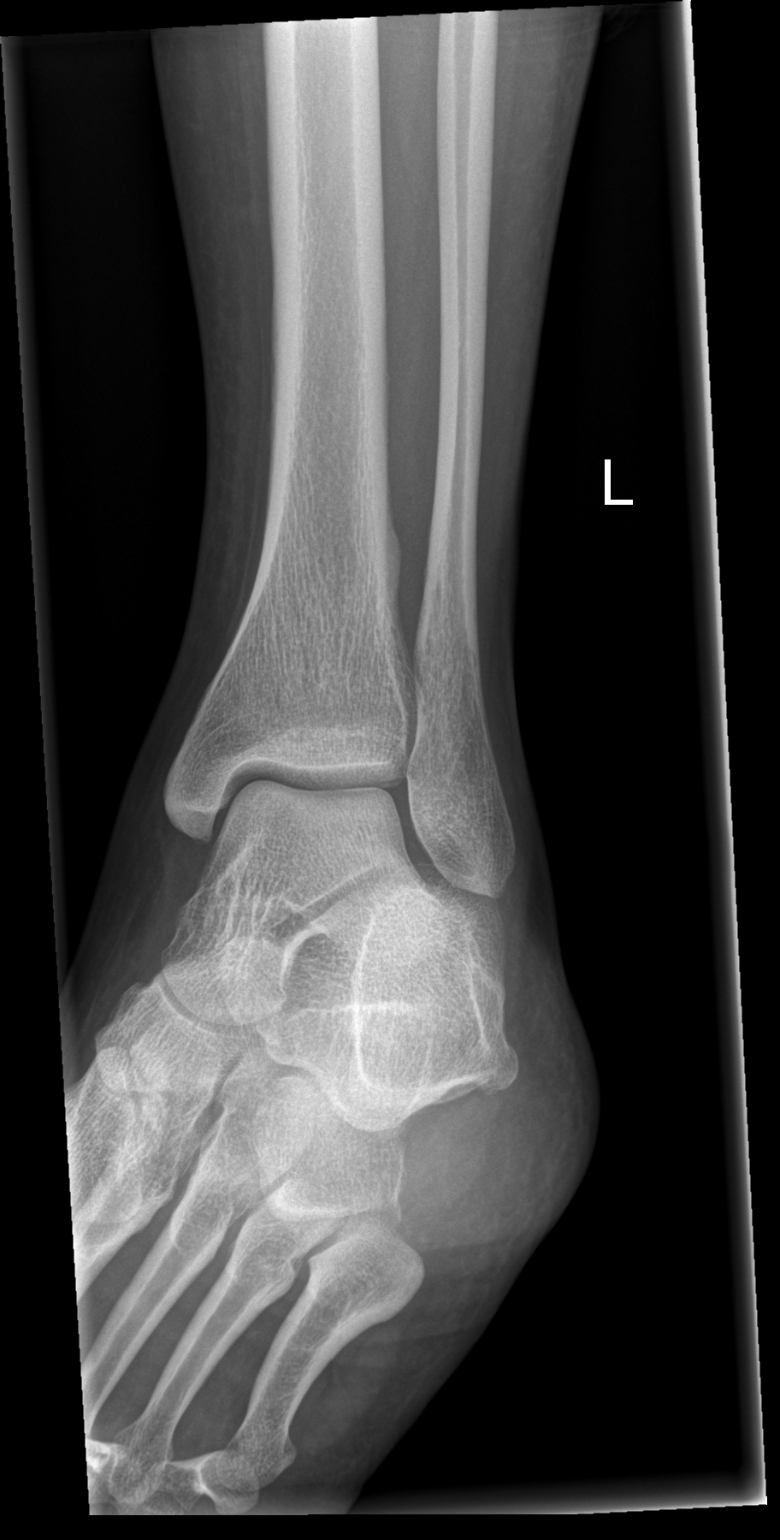

[x ankle lat left]
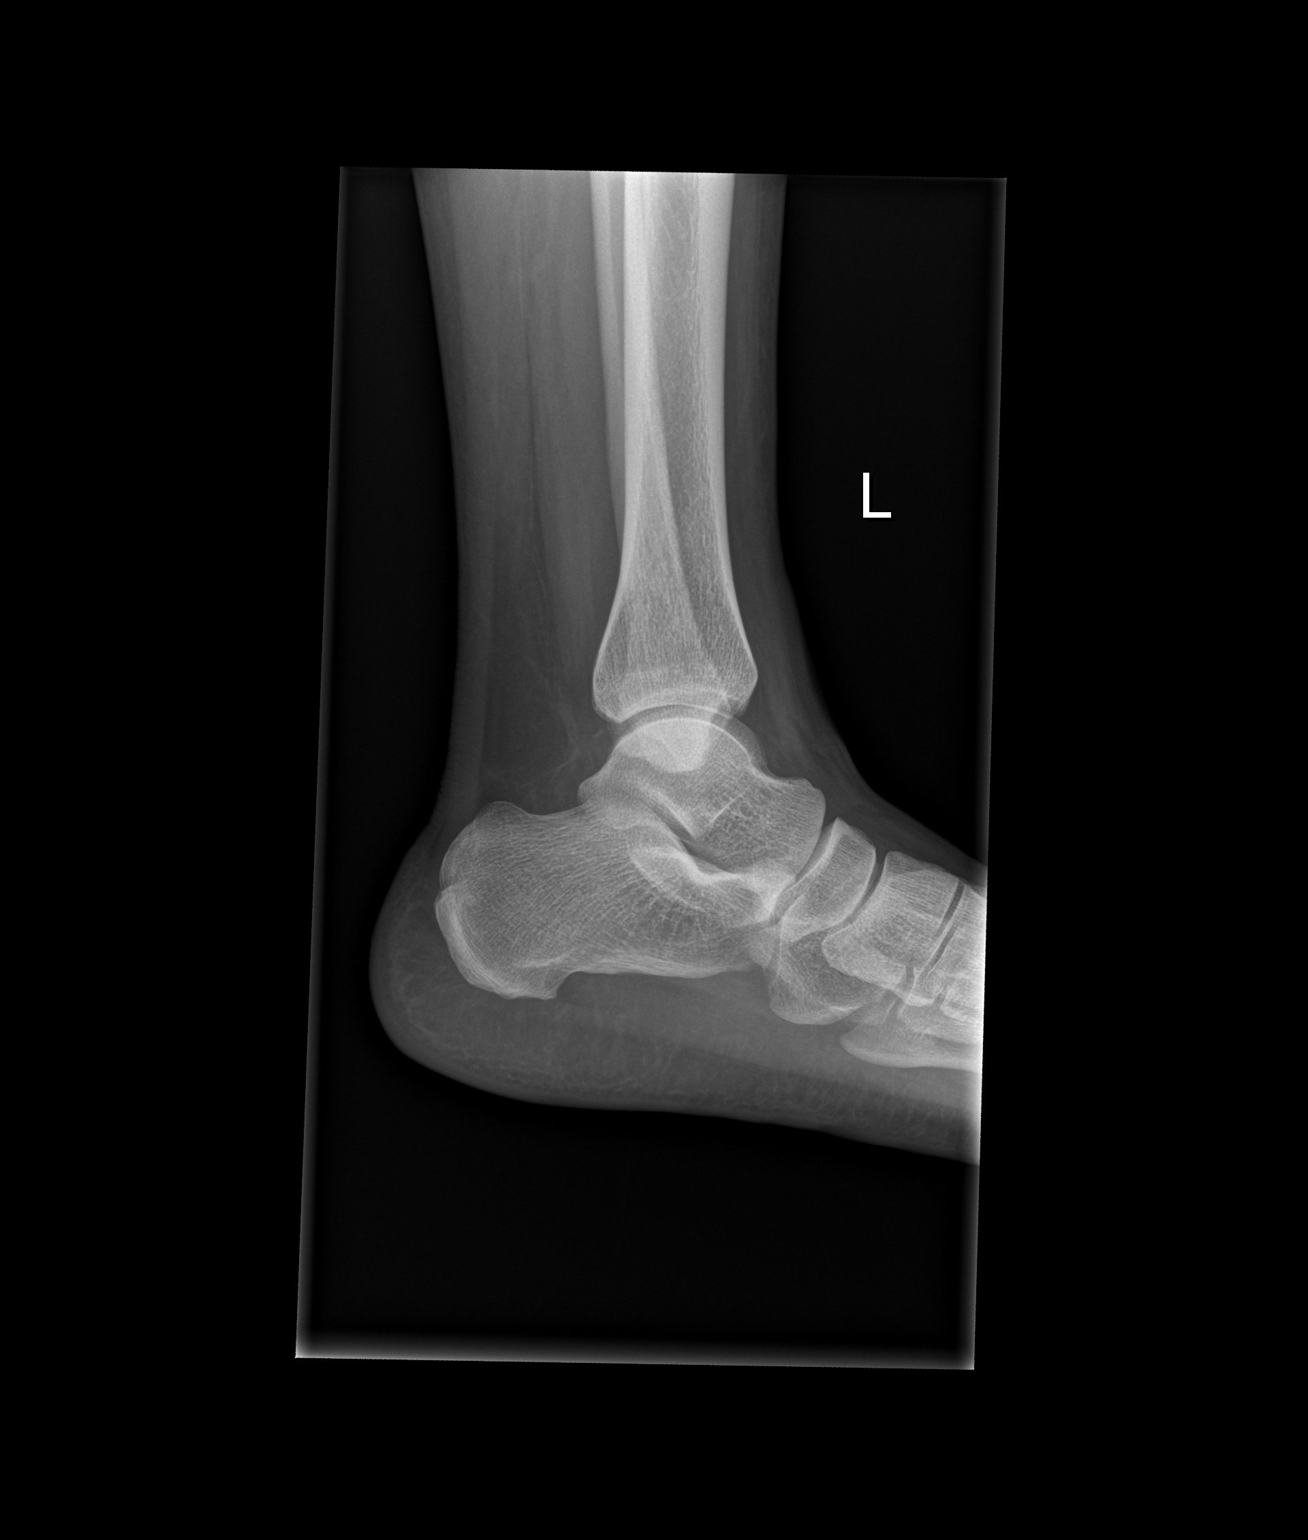

[3 of 3 positions shown; findings below may reference images not displayed]

FINDINGS: There is no evidence of fracture, dislocation, or joint effusion.
There is no evidence of arthropathy or other focal bone abnormality.
Soft tissues are unremarkable.
IMPRESSION: Negative evaluation of the LEFT ankle

## 2022-07-07 IMAGING — CR DG HIP (WITH OR WITHOUT PELVIS) 2-3V*L*
3 series · 3 of 3 positions shown · non-contrast
Comparison: None.

CLINICAL DATA: Fell in kitchen at work injuring the left hip. Left
hip pain.

EXAM:
DG HIP (WITH OR WITHOUT PELVIS) 2-3V LEFT

[t pelvis ap]
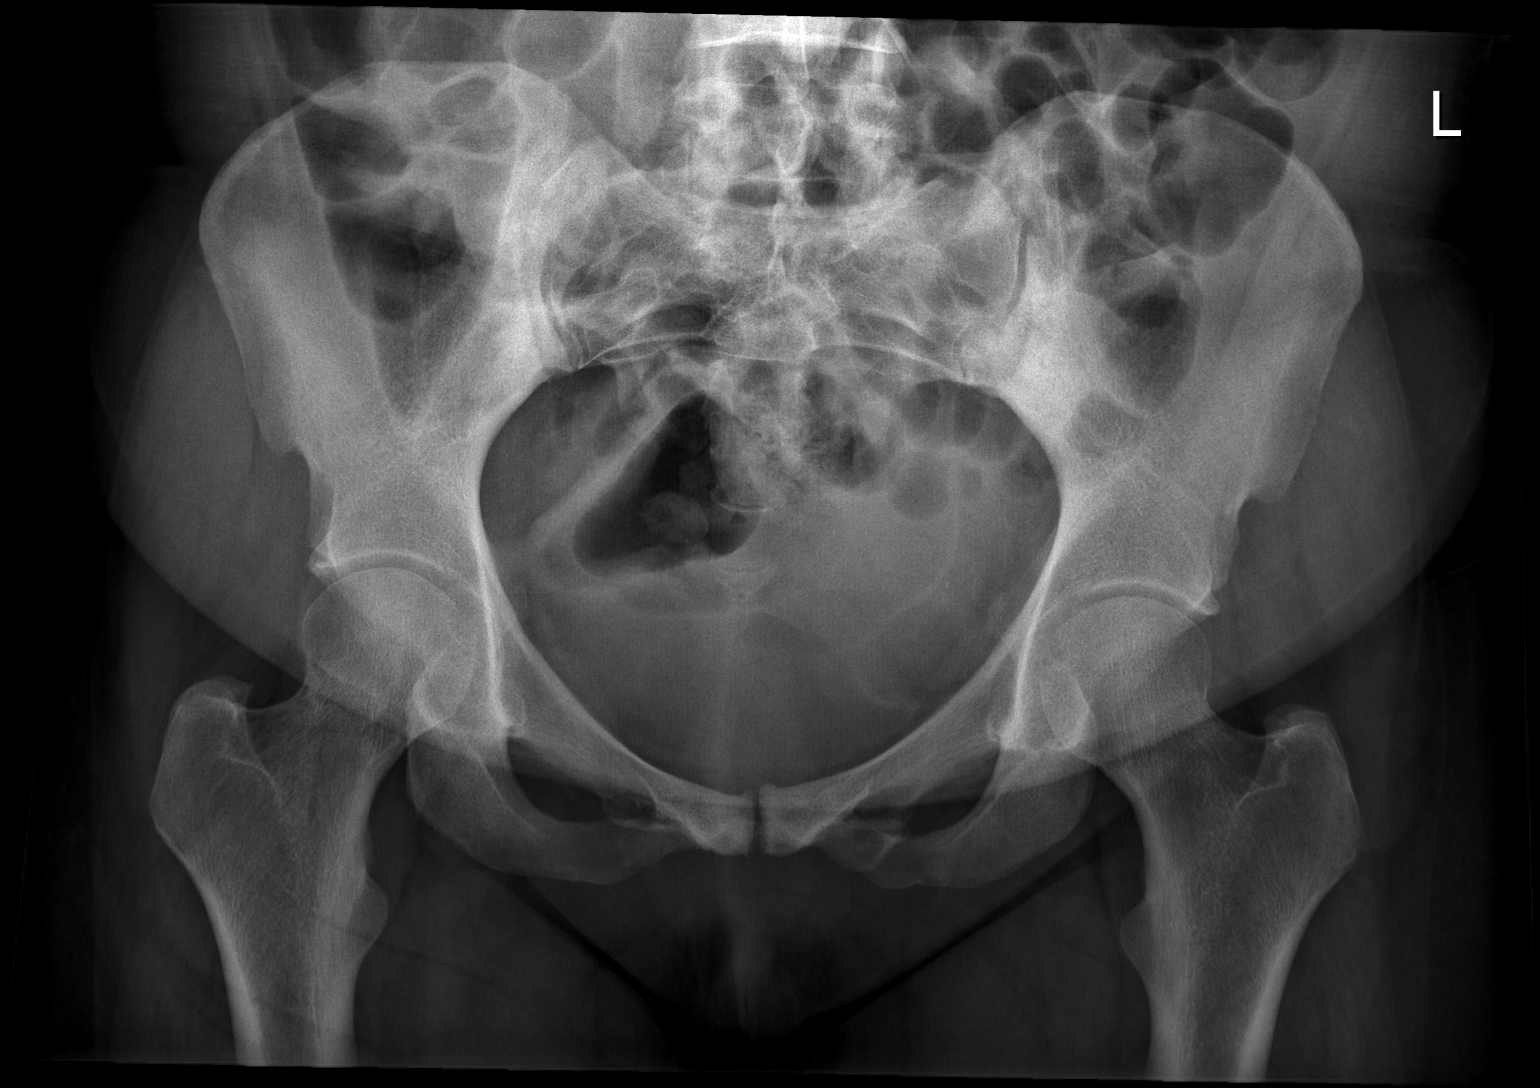

[t hip ap left]
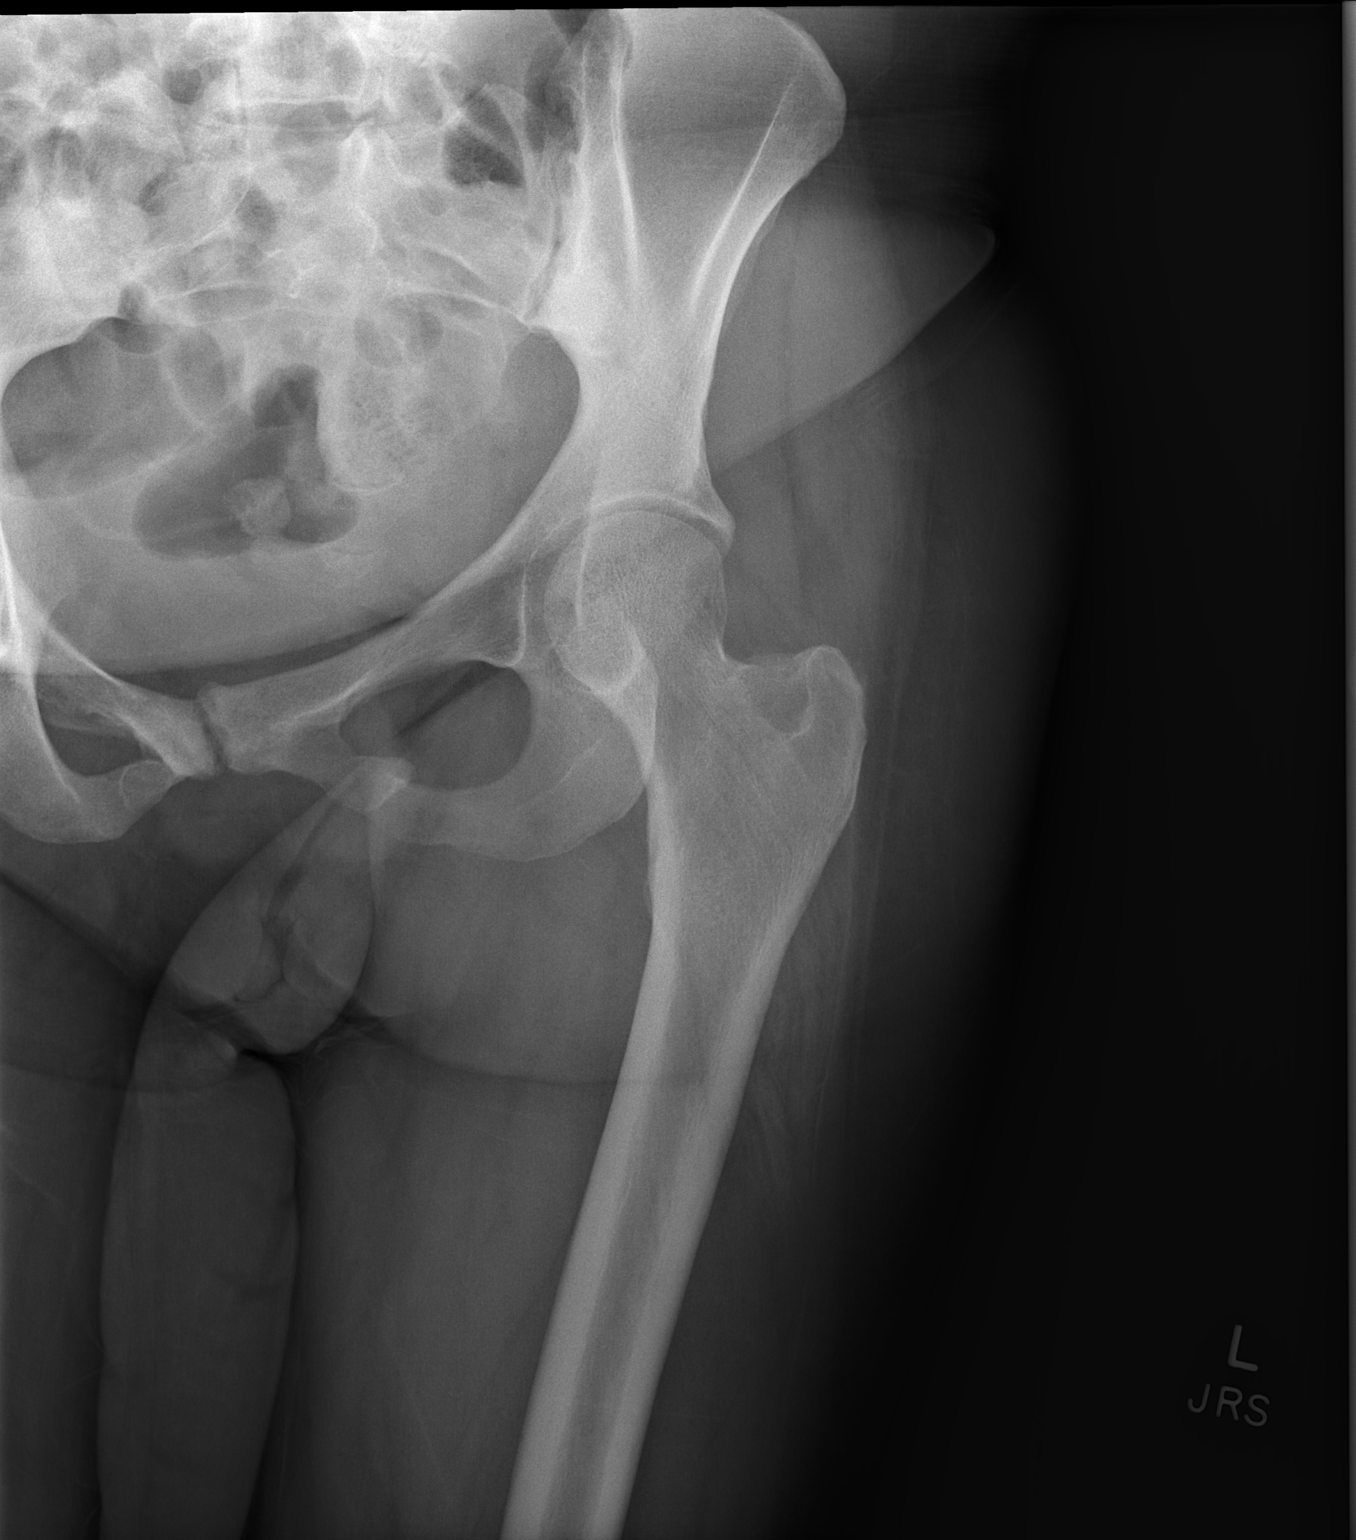

[t hip frog leg left]
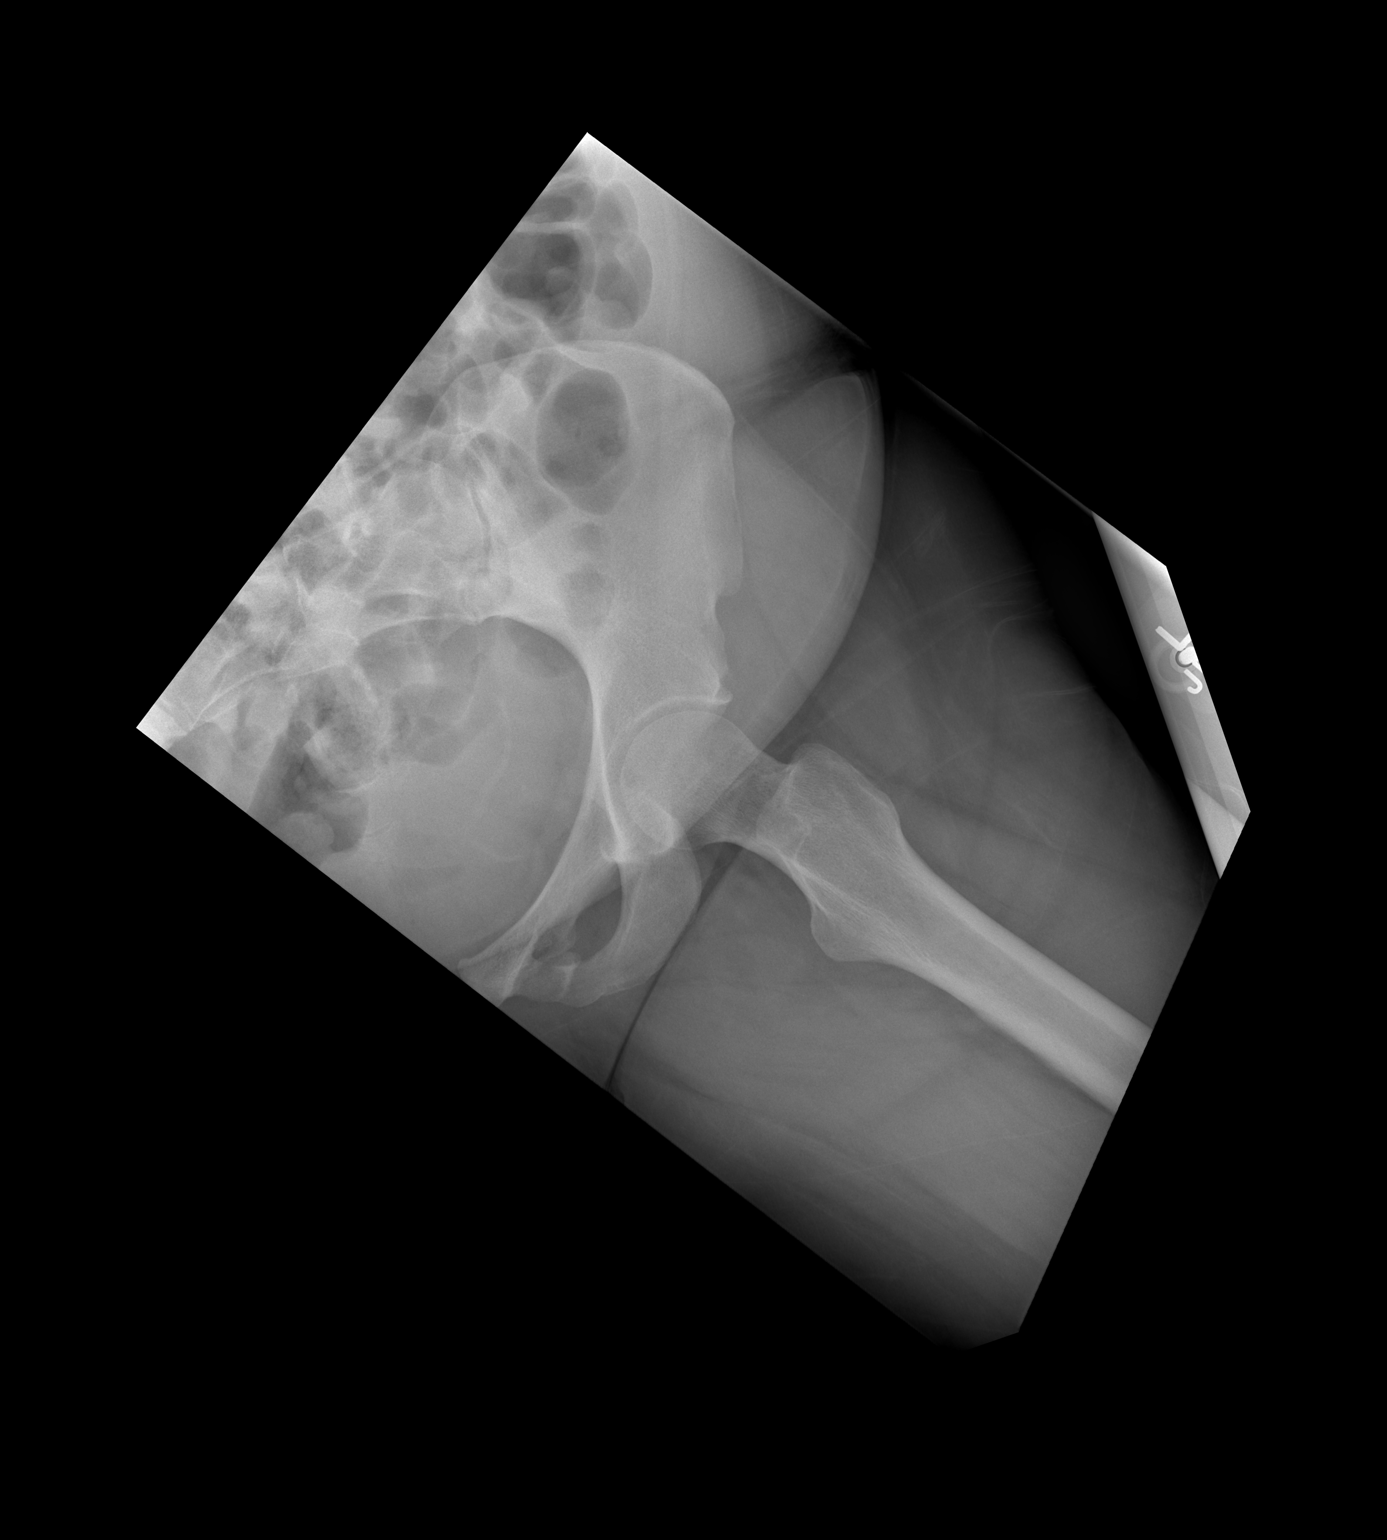

[3 of 3 positions shown; findings below may reference images not displayed]

FINDINGS: There is no evidence of hip fracture or dislocation. There is no
evidence of arthropathy or other focal bone abnormality.
IMPRESSION: Negative.

## 2024-03-15 ENCOUNTER — Observation Stay (HOSPITAL_COMMUNITY)

## 2024-03-15 ENCOUNTER — Encounter (HOSPITAL_COMMUNITY): Payer: Self-pay | Admitting: Emergency Medicine

## 2024-03-15 ENCOUNTER — Emergency Department (HOSPITAL_COMMUNITY)

## 2024-03-15 ENCOUNTER — Other Ambulatory Visit: Payer: Self-pay

## 2024-03-15 ENCOUNTER — Observation Stay (HOSPITAL_COMMUNITY)
Admission: EM | Admit: 2024-03-15 | Discharge: 2024-03-16 | Disposition: A | Discharge status: Home / Self Care | Attending: Internal Medicine | Admitting: Internal Medicine

## 2024-03-15 DIAGNOSIS — E876 Hypokalemia: Principal | ICD-10-CM | POA: Diagnosis present

## 2024-03-15 DIAGNOSIS — R531 Weakness: Secondary | ICD-10-CM | POA: Diagnosis present

## 2024-03-15 DIAGNOSIS — D509 Iron deficiency anemia, unspecified: Principal | ICD-10-CM | POA: Insufficient documentation

## 2024-03-15 DIAGNOSIS — R079 Chest pain, unspecified: Secondary | ICD-10-CM | POA: Diagnosis not present

## 2024-03-15 DIAGNOSIS — R197 Diarrhea, unspecified: Secondary | ICD-10-CM | POA: Diagnosis not present

## 2024-03-15 DIAGNOSIS — F17210 Nicotine dependence, cigarettes, uncomplicated: Secondary | ICD-10-CM | POA: Insufficient documentation

## 2024-03-15 DIAGNOSIS — R6881 Early satiety: Secondary | ICD-10-CM | POA: Diagnosis not present

## 2024-03-15 DIAGNOSIS — R030 Elevated blood-pressure reading, without diagnosis of hypertension: Secondary | ICD-10-CM | POA: Diagnosis not present

## 2024-03-15 DIAGNOSIS — R634 Abnormal weight loss: Secondary | ICD-10-CM | POA: Insufficient documentation

## 2024-03-15 LAB — CBC WITH DIFFERENTIAL/PLATELET
Abs Immature Granulocytes: 0.02 10*3/uL (ref 0.00–0.07)
Basophils Absolute: 0 10*3/uL (ref 0.0–0.1)
Basophils Relative: 1 %
Eosinophils Absolute: 0.4 10*3/uL (ref 0.0–0.5)
Eosinophils Relative: 6 %
HCT: 34.5 % — ABNORMAL LOW (ref 36.0–46.0)
Hemoglobin: 10 g/dL — ABNORMAL LOW (ref 12.0–15.0)
Immature Granulocytes: 0 %
Lymphocytes Relative: 33 %
Lymphs Abs: 2.1 10*3/uL (ref 0.7–4.0)
MCH: 19.5 pg — ABNORMAL LOW (ref 26.0–34.0)
MCHC: 29 g/dL — ABNORMAL LOW (ref 30.0–36.0)
MCV: 67.1 fL — ABNORMAL LOW (ref 80.0–100.0)
Monocytes Absolute: 0.5 10*3/uL (ref 0.1–1.0)
Monocytes Relative: 8 %
Neutro Abs: 3.4 10*3/uL (ref 1.7–7.7)
Neutrophils Relative %: 52 %
Platelets: 373 10*3/uL (ref 150–400)
RBC: 5.14 MIL/uL — ABNORMAL HIGH (ref 3.87–5.11)
RDW: 20.8 % — ABNORMAL HIGH (ref 11.5–15.5)
WBC: 6.4 10*3/uL (ref 4.0–10.5)
nRBC: 0 % (ref 0.0–0.2)

## 2024-03-15 LAB — COMPREHENSIVE METABOLIC PANEL WITH GFR
ALT: 13 U/L (ref 0–44)
AST: 22 U/L (ref 15–41)
Albumin: 3.5 g/dL (ref 3.5–5.0)
Alkaline Phosphatase: 92 U/L (ref 38–126)
Anion gap: 12 (ref 5–15)
BUN: 14 mg/dL (ref 6–20)
CO2: 27 mmol/L (ref 22–32)
Calcium: 8.6 mg/dL — ABNORMAL LOW (ref 8.9–10.3)
Chloride: 92 mmol/L — ABNORMAL LOW (ref 98–111)
Creatinine, Ser: 0.99 mg/dL (ref 0.44–1.00)
GFR, Estimated: >60 mL/min (ref >60)
Glucose, Bld: 135 mg/dL — ABNORMAL HIGH (ref 70–99)
Potassium: 2.6 mmol/L — CL (ref 3.5–5.1)
Sodium: 131 mmol/L — ABNORMAL LOW (ref 135–145)
Total Bilirubin: 1 mg/dL (ref 0.0–1.2)
Total Protein: 7.8 g/dL (ref 6.5–8.1)

## 2024-03-15 LAB — URINALYSIS, ROUTINE W REFLEX MICROSCOPIC
Bilirubin Urine: NEGATIVE
Glucose, UA: NEGATIVE mg/dL
Hgb urine dipstick: NEGATIVE
Ketones, ur: NEGATIVE mg/dL
Nitrite: NEGATIVE
Protein, ur: NEGATIVE mg/dL
Specific Gravity, Urine: 1.004 — ABNORMAL LOW (ref 1.005–1.030)
pH: 6 (ref 5.0–8.0)

## 2024-03-15 LAB — PREGNANCY, URINE: Preg Test, Ur: NEGATIVE

## 2024-03-15 LAB — IRON AND TIBC
Iron: 29 ug/dL (ref 28–170)
Saturation Ratios: 5 % — ABNORMAL LOW (ref 10.4–31.8)
TIBC: 566 ug/dL — ABNORMAL HIGH (ref 250–450)
UIBC: 537 ug/dL

## 2024-03-15 LAB — MAGNESIUM: Magnesium: 1.5 mg/dL — ABNORMAL LOW (ref 1.7–2.4)

## 2024-03-15 LAB — LIPASE, BLOOD: Lipase: 61 U/L — ABNORMAL HIGH (ref 11–51)

## 2024-03-15 LAB — HEMOGLOBIN A1C
Hgb A1c MFr Bld: 5.6 % (ref 4.8–5.6)
Mean Plasma Glucose: 114.02 mg/dL

## 2024-03-15 LAB — FERRITIN: Ferritin: 7 ng/mL — ABNORMAL LOW (ref 11–307)

## 2024-03-15 LAB — HIV ANTIBODY (ROUTINE TESTING W REFLEX): HIV Screen 4th Generation wRfx: NONREACTIVE

## 2024-03-15 LAB — TSH: TSH: 0.753 u[IU]/mL (ref 0.350–4.500)

## 2024-03-15 MED ORDER — NICOTINE 14 MG/24HR TD PT24
14.0000 mg | MEDICATED_PATCH | Freq: Every day | TRANSDERMAL | Status: DC
  Filled 2024-03-15: qty 1

## 2024-03-15 MED ORDER — MAGNESIUM SULFATE 2 GM/50ML IV SOLN
2.0000 g | Freq: Once | INTRAVENOUS | Status: AC
  Administered 2024-03-15: 2 g via INTRAVENOUS
  Filled 2024-03-15: qty 50

## 2024-03-15 MED ORDER — POTASSIUM CHLORIDE CRYS ER 20 MEQ PO TBCR
40.0000 meq | EXTENDED_RELEASE_TABLET | Freq: Once | ORAL | Status: AC
  Administered 2024-03-15: 40 meq via ORAL
  Filled 2024-03-15: qty 2

## 2024-03-15 MED ORDER — IOHEXOL 300 MG/ML  SOLN
100.0000 mL | Freq: Once | INTRAMUSCULAR | Status: AC | PRN
  Administered 2024-03-15: 100 mL via INTRAVENOUS

## 2024-03-15 MED ORDER — ENOXAPARIN SODIUM 40 MG/0.4ML IJ SOSY
40.0000 mg | PREFILLED_SYRINGE | INTRAMUSCULAR | Status: DC
  Administered 2024-03-15: 40 mg via SUBCUTANEOUS
  Filled 2024-03-15: qty 0.4

## 2024-03-15 MED ORDER — POTASSIUM CHLORIDE 10 MEQ/100ML IV SOLN
10.0000 meq | Freq: Once | INTRAVENOUS | Status: AC
  Administered 2024-03-15: 10 meq via INTRAVENOUS
  Filled 2024-03-15: qty 100

## 2024-03-15 MED ORDER — SODIUM CHLORIDE 0.9 % IV BOLUS
1000.0000 mL | Freq: Once | INTRAVENOUS | Status: AC
  Administered 2024-03-15: 1000 mL via INTRAVENOUS

## 2024-03-15 MED ORDER — LABETALOL HCL 5 MG/ML IV SOLN
20.0000 mg | INTRAVENOUS | Status: DC | PRN
  Administered 2024-03-15 (×2): 20 mg via INTRAVENOUS
  Filled 2024-03-15 (×2): qty 4

## 2024-03-15 NOTE — ED Notes (Signed)
Patient still unable to provide urine sample

## 2024-03-15 NOTE — Progress Notes (Signed)
 Pt refusing cardiac monitor. RN educated patient on importance of wearing cardiac monitor due to electrolyte imbalances, pt continues to refuse. On call provider notified and aware.

## 2024-03-15 NOTE — ED Notes (Signed)
 Patient transported to CT

## 2024-03-15 NOTE — H&P (Addendum)
 History and Physical    Crystal Webster:[id number] DOB: [birth year] DOA: 03/15/2024  PCP: Pcp, No  Patient coming from: home   Chief Complaint: unintentional weight loss  HPI: Crystal Webster is a 48 y.o. female with medical history significant for tobacco abuse presents with the above.  Reports about a month of 20 pound weight loss. Has lost her appetite and reports feeling full quickly so hasn't been eating much. Stool is looser than normal as well. No abdominal or other pain, no cough or fevers. Menstruates regularly, not heavy. No bloody or black stool. No dysuria. Drinks occasionally, no drugs, does smoke. Doesn't see a doctor regularly. No prescription drugs, only otc drug is occasional tylenol .   Review of Systems: As per HPI otherwise 10 point review of systems negative.    Past Medical History:  Diagnosis Date   Ankle fracture, bimalleolar, closed 08/30/2014   Smoker     Past Surgical History:  Procedure Laterality Date   NO PAST SURGERIES     ORIF ANKLE FRACTURE Right 09/06/2014   Procedure: OPEN REDUCTION INTERNAL FIXATION (ORIF) BIMALLEOLAR ANKLE FRACTURE;  Surgeon: Adah Acron, MD;  Location: [facility] OR;  Service: Orthopedics;  Laterality: Right;     reports that she has been smoking cigarettes. She has a 20 pack-year smoking history. She has never used smokeless tobacco. She reports current drug use. She reports that she does not drink alcohol.  No Known Allergies  History reviewed. No pertinent family history.  Prior to Admission medications   Not on File    Physical Exam: Vitals:   03/15/24 1045 03/15/24 1115 03/15/24 1251 03/15/24 1300  BP: (!) 170/110 (!) 148/115  (!) 178/104  Pulse: 70 71  69  Resp: 17 11  19   Temp:   98.2 F (36.8 C)   SpO2: 100% 98%  100%  Weight:      Height:        Constitutional: No acute distress Head: Atraumatic Eyes: Conjunctiva clear ENM: Moist mucous membranes. Normal dentition.  Neck: Supple Respiratory: Clear to  auscultation bilaterally, no wheezing/rales/rhonchi. Normal respiratory effort. No accessory muscle use. . Cardiovascular: Regular rate and rhythm. No murmurs/rubs/gallops. Abdomen: Non-tender, non-distended. No masses. No rebound or guarding. Positive bowel sounds. Musculoskeletal: No joint deformity upper and lower extremities. Normal ROM, no contractures. Normal muscle tone.  Skin: No rashes, lesions, or ulcers.  Extremities: No peripheral edema. Palpable peripheral pulses. Neurologic: Alert, moving all 4 extremities. Psychiatric: Normal insight and judgement.   Labs on Admission: I have personally reviewed following labs and imaging studies  CBC: Recent Labs  Lab 03/15/24 0907  WBC 6.4  NEUTROABS 3.4  HGB 10.0*  HCT 34.5*  MCV 67.1*  PLT 373   Basic Metabolic Panel: Recent Labs  Lab 03/15/24 0907  NA 131*  K 2.6*  CL 92*  CO2 27  GLUCOSE 135*  BUN 14  CREATININE 0.99  CALCIUM 8.6*  MG 1.5*   GFR: Estimated Creatinine Clearance: 59.4 mL/min (by C-G formula based on SCr of 0.99 mg/dL). Liver Function Tests: Recent Labs  Lab 03/15/24 0907  AST 22  ALT 13  ALKPHOS 92  BILITOT 1.0  PROT 7.8  ALBUMIN 3.5   Recent Labs  Lab 03/15/24 0907  LIPASE 61*   No results for input(s): "AMMONIA" in the last 168 hours. Coagulation Profile: No results for input(s): "INR", "PROTIME" in the last 168 hours. Cardiac Enzymes: No results for input(s): "CKTOTAL", "CKMB", "CKMBINDEX", "TROPONINI" in the last 168 hours.  BNP (last 3 results) No results for input(s): "PROBNP" in the last 8760 hours. HbA1C: No results for input(s): "HGBA1C" in the last 72 hours. CBG: No results for input(s): "GLUCAP" in the last 168 hours. Lipid Profile: No results for input(s): "CHOL", "HDL", "LDLCALC", "TRIG", "CHOLHDL", "LDLDIRECT" in the last 72 hours. Thyroid Function Tests: No results for input(s): "TSH", "T4TOTAL", "FREET4", "T3FREE", "THYROIDAB" in the last 72 hours. Anemia  Panel: Recent Labs    03/15/24 1047  TIBC 566*  IRON 29   Urine analysis:    Component Value Date/Time   COLORURINE STRAW (A) 03/15/2024 0913   APPEARANCEUR CLEAR 03/15/2024 0913   LABSPEC 1.004 (L) 03/15/2024 0913   PHURINE 6.0 03/15/2024 0913   GLUCOSEU NEGATIVE 03/15/2024 0913   HGBUR NEGATIVE 03/15/2024 0913   BILIRUBINUR NEGATIVE 03/15/2024 0913   KETONESUR NEGATIVE 03/15/2024 0913   PROTEINUR NEGATIVE 03/15/2024 0913   NITRITE NEGATIVE 03/15/2024 0913   LEUKOCYTESUR LARGE (A) 03/15/2024 0913    Radiological Exams on Admission: DG Chest Portable 1 View Result Date: 03/15/2024 CLINICAL DATA:  Weakness.  History of smoking.  Question pneumonia. EXAM: PORTABLE CHEST 1 VIEW COMPARISON:  05/09/2020 FINDINGS: Cardiopericardial silhouette is at upper limits of normal for size. The lungs are clear without focal pneumonia, edema, pneumothorax or pleural effusion. No acute bony abnormality. Telemetry leads overlie the chest. IMPRESSION: No active disease. Electronically Signed   By: Donnal Fusi M.D.   On: 03/15/2024 10:19    EKG: Independently reviewed. sinus  Assessment/Plan Principal Problem:   Hypokalemia   # Early satiety # Unintentional weight loss With electrolyte abnormalities - hiv, hcv, A1c, fecal occult blood - ct chest/abdomen/pelvis, if negative will likely need outpt endoluminal eval  # Loose stools - gi pathogen panel and c diff  # Hypokalemia # Hypomagnesemia 2/2 reduced PO - replete and monitor  # Microcytic anemia Likely iron deficiency. Concern for gi malignancy as above, though possibly 2/2 menses - f/u ferritin - trend  # Elevated BP Likely undiagnosed htn - monitor for now  DVT prophylaxis: lovenox Code Status: full  Family Communication: none at bedside  Consults called: none   Level of care: Med-Surg Status is: Observation    Raymonde Calico MD [facility] Pager [phone]  If 7PM-7AM, please contact  night-coverage [email or web address] Password [email or web address]  03/15/2024, 2:05 PM

## 2024-03-15 NOTE — ED Notes (Signed)
 X-ray at bedside

## 2024-03-15 NOTE — ED Notes (Signed)
 Patient still unable to use bathroom.

## 2024-03-15 NOTE — Plan of Care (Signed)

## 2024-03-15 NOTE — ED Provider Notes (Signed)
 [place] EMERGENCY DEPARTMENT AT [facility] Provider Note   CSN: [id number] Arrival date & time: 03/15/24  8295     History  Chief Complaint  Patient presents with   Nausea   Weakness    Crystal Webster is a 48 y.o. female.  With a history of tobacco use who presents to the ED for weakness.  She reports 1 month of ongoing generalized weakness along with nausea some vomiting decreased appetite.  She also mentions 20 pound weight loss over the last month as well.  Over the last few days she has been mostly bedridden as she is too weak to go about her day prompting her to seek evaluation in the ED.  No chest pain shortness of breath fevers or chills.  Does not take any medication daily and does not have a primary care doctor.  Has smoked 1 pack/day since age of 44   Weakness      Home Medications Prior to Admission medications   Not on File      Allergies    Patient has no known allergies.    Review of Systems   Review of Systems  Neurological:  Positive for weakness.    Physical Exam Updated Vital Signs BP (!) 178/104   Pulse 69   Temp 98.2 F (36.8 C)   Resp 19   Ht 5\' 4"  (1.626 m)   Wt 65.6 kg   LMP 03/02/2024   SpO2 100%   BMI 24.82 kg/m  Physical Exam Vitals and nursing note reviewed.  HENT:     Head: Normocephalic and atraumatic.  Eyes:     Pupils: Pupils are equal, round, and reactive to light.  Cardiovascular:     Rate and Rhythm: Normal rate and regular rhythm.  Pulmonary:     Effort: Pulmonary effort is normal.     Breath sounds: Normal breath sounds.  Abdominal:     Palpations: Abdomen is soft.     Tenderness: There is no abdominal tenderness.  Skin:    General: Skin is warm and dry.  Neurological:     Mental Status: She is alert and oriented to person, place, and time.     Sensory: No sensory deficit.     Motor: No weakness.  Psychiatric:        Mood and Affect: Mood normal.     ED Results / Procedures / Treatments    Labs (all labs ordered are listed, but only abnormal results are displayed) Labs Reviewed  CBC WITH DIFFERENTIAL/PLATELET - Abnormal; Notable for the following components:      Result Value   RBC 5.14 (*)    Hemoglobin 10.0 (*)    HCT 34.5 (*)    MCV 67.1 (*)    MCH 19.5 (*)    MCHC 29.0 (*)    RDW 20.8 (*)    All other components within normal limits  MAGNESIUM - Abnormal; Notable for the following components:   Magnesium 1.5 (*)    All other components within normal limits  COMPREHENSIVE METABOLIC PANEL WITH GFR - Abnormal; Notable for the following components:   Sodium 131 (*)    Potassium 2.6 (*)    Chloride 92 (*)    Glucose, Bld 135 (*)    Calcium 8.6 (*)    All other components within normal limits  LIPASE, BLOOD - Abnormal; Notable for the following components:   Lipase 61 (*)    All other components within normal limits  URINALYSIS, ROUTINE  W REFLEX MICROSCOPIC - Abnormal; Notable for the following components:   Color, Urine STRAW (*)    Specific Gravity, Urine 1.004 (*)    Leukocytes,Ua LARGE (*)    Bacteria, UA RARE (*)    All other components within normal limits  IRON AND TIBC - Abnormal; Notable for the following components:   TIBC 566 (*)    Saturation Ratios 5 (*)    All other components within normal limits  PREGNANCY, URINE  FERRITIN    EKG EKG Interpretation Date/Time:  Thursday Mar 15 2024 09:24:43 EDT Ventricular Rate:  78 PR Interval:  166 QRS Duration:  86 QT Interval:  404 QTC Calculation: 461 R Axis:   17  Text Interpretation: Sinus rhythm Minimal ST depression, lateral leads Baseline wander in lead(s) III aVL Confirmed by Rafael Bun [phone]) on 03/15/2024 10:36:37 AM  Radiology DG Chest Portable 1 View Result Date: 03/15/2024 CLINICAL DATA:  Weakness.  History of smoking.  Question pneumonia. EXAM: PORTABLE CHEST 1 VIEW COMPARISON:  05/09/2020 FINDINGS: Cardiopericardial silhouette is at upper limits of normal for size. The lungs are  clear without focal pneumonia, edema, pneumothorax or pleural effusion. No acute bony abnormality. Telemetry leads overlie the chest. IMPRESSION: No active disease. Electronically Signed   By: Donnal Fusi M.D.   On: 03/15/2024 10:19    Procedures .Critical Care  Performed by: Sallyanne Creamer, DO Authorized by: Sallyanne Creamer, DO   Critical care provider statement:    Critical care time (minutes):  30   Critical care was necessary to treat or prevent imminent or life-threatening deterioration of the following conditions:  Metabolic crisis   Critical care was time spent personally by me on the following activities:  Development of treatment plan with patient or surrogate, discussions with consultants, evaluation of patient's response to treatment, examination of patient, ordering and review of laboratory studies, ordering and review of radiographic studies, ordering and performing treatments and interventions, pulse oximetry, re-evaluation of patient's condition, review of old charts and obtaining history from patient or surrogate   I assumed direction of critical care for this patient from another provider in my specialty: no     Care discussed with: admitting provider   Comments:     Critical hypokalemia and hypomagnesemia     Medications Ordered in ED Medications  sodium chloride  0.9 % bolus 1,000 mL (0 mLs Intravenous Stopped 03/15/24 1117)  potassium chloride  SA (KLOR-CON  M) CR tablet 40 mEq (40 mEq Oral Given 03/15/24 1007)  potassium chloride  10 mEq in 100 mL IVPB (0 mEq Intravenous Stopped 03/15/24 1117)  magnesium sulfate IVPB 2 g 50 mL (0 g Intravenous Stopped 03/15/24 1117)    ED Course/ Medical Decision Making/ A&P Clinical Course as of 03/15/24 1357  Thu Mar 15, 2024  1007 Initial laboratory workup notable for microcytic anemia most likely iron deficiency anemia.  Also hypokalemia and hypomagnesemia.  Will replete potassium and magnesium here.  EKG without evidence of  dysrhythmia or ischemic changes. [MP]  1355 No evidence of UTI.  Given concern for multiple electrolyte derangements, poor p.o. intake and anemia with poor access to healthcare patient would benefit from admission for further evaluation.  Discussed admitting hospitalist excepting for admission [MP]    Clinical Course User Index [MP] Sallyanne Creamer, DO                                 Medical Decision Making  48 year old female with history of smoking no other known medical issues.  Does not have primary care doctor and does not take medications.  She is here today for symptoms of generalized weakness nausea decreased p.o. intake and recent weight loss over the last month.  Has lost 20 pounds in the last month.  Afebrile and slightly hypertensive on my exam.  Differential diagnosis for his symptoms would include malnutrition due to decreased p.o. intake, electrolyte imbalance, anemia, acute infectious process such as pneumonia.  Considering her longstanding history of smoking with poor access to medical care ultimately to consider oncologic process as underlying cause for her weakness, anorexia and weight loss.  Will start with laboratory workup, EKG and chest x-ray provide IV fluids for rehydration.  Amount and/or Complexity of Data Reviewed Labs: ordered. Radiology: ordered.  Risk Prescription drug management. Decision regarding hospitalization.           Final Clinical Impression(s) / ED Diagnoses Final diagnoses:  Microcytic anemia  Hypokalemia  Hypomagnesemia  Cigarette smoker    Rx / DC Orders ED Discharge Orders     None         Sallyanne Creamer, DO 03/15/24 1358

## 2024-03-15 NOTE — ED Triage Notes (Signed)
 Pt comes in pov for nausea and loss of appetite for a month. Weakness has gotten worse the last 4 days. Denies pain. Last time she vomited was 2 days ago.

## 2024-03-15 NOTE — ED Notes (Signed)
Patient attempted urine sample. Unsuccessful.  °

## 2024-03-16 ENCOUNTER — Other Ambulatory Visit (HOSPITAL_COMMUNITY): Payer: Self-pay

## 2024-03-16 DIAGNOSIS — E876 Hypokalemia: Secondary | ICD-10-CM | POA: Diagnosis not present

## 2024-03-16 LAB — CBC
HCT: 27.9 % — ABNORMAL LOW (ref 36.0–46.0)
Hemoglobin: 8 g/dL — ABNORMAL LOW (ref 12.0–15.0)
MCH: 20.1 pg — ABNORMAL LOW (ref 26.0–34.0)
MCHC: 28.7 g/dL — ABNORMAL LOW (ref 30.0–36.0)
MCV: 69.9 fL — ABNORMAL LOW (ref 80.0–100.0)
Platelets: 282 10*3/uL (ref 150–400)
RBC: 3.99 MIL/uL (ref 3.87–5.11)
RDW: 20.8 % — ABNORMAL HIGH (ref 11.5–15.5)
WBC: 5.2 10*3/uL (ref 4.0–10.5)
nRBC: 0 % (ref 0.0–0.2)

## 2024-03-16 LAB — RAPID URINE DRUG SCREEN, HOSP PERFORMED
Amphetamines: NOT DETECTED
Barbiturates: NOT DETECTED
Benzodiazepines: NOT DETECTED
Cocaine: NOT DETECTED
Opiates: NOT DETECTED
Tetrahydrocannabinol: NOT DETECTED

## 2024-03-16 LAB — BASIC METABOLIC PANEL WITH GFR
Anion gap: 9 (ref 5–15)
BUN: 14 mg/dL (ref 6–20)
CO2: 26 mmol/L (ref 22–32)
Calcium: 8.1 mg/dL — ABNORMAL LOW (ref 8.9–10.3)
Chloride: 97 mmol/L — ABNORMAL LOW (ref 98–111)
Creatinine, Ser: 0.95 mg/dL (ref 0.44–1.00)
GFR, Estimated: >60 mL/min (ref >60)
Glucose, Bld: 100 mg/dL — ABNORMAL HIGH (ref 70–99)
Potassium: 3.1 mmol/L — ABNORMAL LOW (ref 3.5–5.1)
Sodium: 132 mmol/L — ABNORMAL LOW (ref 135–145)

## 2024-03-16 LAB — HCV AB W REFLEX TO QUANT PCR: HCV Ab: NONREACTIVE

## 2024-03-16 LAB — HCV INTERPRETATION

## 2024-03-16 LAB — MAGNESIUM: Magnesium: 1.9 mg/dL (ref 1.7–2.4)

## 2024-03-16 MED ORDER — IRON 325 (65 FE) MG PO TABS
325.0000 mg | ORAL_TABLET | Freq: Every day | ORAL | 0 refills | Status: DC
  Filled 2024-03-16: qty 30, 30d supply, fill #0

## 2024-03-16 MED ORDER — POTASSIUM CHLORIDE CRYS ER 20 MEQ PO TBCR
40.0000 meq | EXTENDED_RELEASE_TABLET | Freq: Once | ORAL | Status: AC
  Administered 2024-03-16: 40 meq via ORAL
  Filled 2024-03-16: qty 2

## 2024-03-16 MED ORDER — LOSARTAN POTASSIUM 50 MG PO TABS
25.0000 mg | ORAL_TABLET | Freq: Every day | ORAL | Status: DC
  Administered 2024-03-16: 25 mg via ORAL
  Filled 2024-03-16: qty 1

## 2024-03-16 MED ORDER — POTASSIUM CHLORIDE 20 MEQ PO PACK
40.0000 meq | PACK | Freq: Once | ORAL | Status: AC
  Administered 2024-03-16: 40 meq via ORAL
  Filled 2024-03-16: qty 2

## 2024-03-16 MED ORDER — OMRON 3 SERIES BP MONITOR DEVI
0 refills | Status: AC
  Filled 2024-03-16: qty 1, 30d supply, fill #0

## 2024-03-16 MED ORDER — HYDRALAZINE HCL 20 MG/ML IJ SOLN
10.0000 mg | Freq: Once | INTRAMUSCULAR | Status: AC
  Administered 2024-03-16: 10 mg via INTRAVENOUS
  Filled 2024-03-16: qty 1

## 2024-03-16 MED ORDER — LOSARTAN POTASSIUM 25 MG PO TABS
25.0000 mg | ORAL_TABLET | Freq: Every day | ORAL | 0 refills | Status: DC
  Filled 2024-03-16: qty 30, 30d supply, fill #0

## 2024-03-16 NOTE — Progress Notes (Signed)
Urine drug screen sent down to lab.

## 2024-03-16 NOTE — Discharge Summary (Signed)
 Physician Discharge Summary  Crystal Webster QMV:[id number] DOB: May 24, 1976 DOA: 03/15/2024  PCP: Pcp, No  Admit date: 03/15/2024 Discharge date: 03/16/2024  Admitted From: Home Discharge disposition: Home   Recommendations for Outpatient Follow-Up:   Patient to establish with PCP as she needs to have adjustment of her blood pressure medications Referral to GI for colonoscopy BMP 1 week Smoking cessation   Discharge Diagnosis:   Principal Problem:   Hypokalemia    Discharge Condition: Improved.  Diet recommendation: Low sodium, heart healthy.    Wound care: None.  Code status: Full.   History of Present Illness:   Crystal Webster is a 48 y.o. female with medical history significant for tobacco abuse presents with the above.   Reports about a month of 20 pound weight loss. Has lost her appetite and reports feeling full quickly so hasn't been eating much. Stool is looser than normal as well. No abdominal or other pain, no cough or fevers. Menstruates regularly, not heavy. No bloody or black stool. No dysuria. Drinks occasionally, no drugs, does smoke. Doesn't see a doctor regularly. No prescription drugs, only otc drug is occasional tylenol .   Hospital Course by Problem:   Early satiety/ Unintentional weight loss -Resolved -Reassuringly her CT scan was negative for any malignancy, it did show a uterus full of fibroids but patient denies heavy menstruation as well as fatty liver-will refer to GI for follow-up and colonoscopy    Loose stools - Resolved   Hypokalemia/ Hypomagnesemia -Replete    Microcytic anemia Likely iron deficiency-replace iron - Referral to GI placed    Elevated BP Likely undiagnosed htn - Start losartan with follow-up of BMP in 1 week recreatinine and potassium      Medical Consultants:      Discharge Exam:   Vitals:   03/16/24 1127 03/16/24 1306  BP: (!) 182/111 (!) 161/101  Pulse:  94  Resp:  17  Temp:  98.3 F  (36.8 C)  SpO2:  100%   Vitals:   03/16/24 0453 03/16/24 1118 03/16/24 1127 03/16/24 1306  BP: (!) 152/86 (!) 182/111 (!) 182/111 (!) 161/101  Pulse: 70 66  94  Resp: 18   17  Temp: 98.3 F (36.8 C) (!) 97.4 F (36.3 C)  98.3 F (36.8 C)  TempSrc: Oral   Oral  SpO2: 99% 100%  100%  Weight:      Height:        General exam: Appears calm and comfortable.    The results of significant diagnostics from this hospitalization (including imaging, microbiology, ancillary and laboratory) are listed below for reference.     Procedures and Diagnostic Studies:   CT CHEST ABDOMEN PELVIS W CONTRAST Result Date: 03/15/2024 CLINICAL DATA:  Unintentional weight loss.  Early satiety. EXAM: CT CHEST, ABDOMEN, AND PELVIS WITH CONTRAST TECHNIQUE: Multidetector CT imaging of the chest, abdomen and pelvis was performed following the standard protocol during bolus administration of intravenous contrast. RADIATION DOSE REDUCTION: This exam was performed according to the departmental dose-optimization program which includes automated exposure control, adjustment of the mA and/or kV according to patient size and/or use of iterative reconstruction technique. CONTRAST:  100mL OMNIPAQUE IOHEXOL 300 MG/ML  SOLN COMPARISON:  Chest radiograph earlier today FINDINGS: CT CHEST FINDINGS Cardiovascular: The heart is upper normal in size. No pericardial effusion. The thoracic aorta is normal in caliber. Mediastinum/Nodes: No mediastinal or hilar adenopathy. No enlarged axillary lymph nodes. Decompressed esophagus. No visible thyroid nodule. Lungs/Pleura: Mild central  bronchial thickening. No focal airspace disease. No pulmonary mass or suspicious nodule. No pleural fluid. No features of pulmonary edema. Musculoskeletal: There are no acute or suspicious osseous abnormalities. Incidental intramuscular lipoma within the right lateral transversus abdominus muscle. No large breast mass by CT. CT ABDOMEN PELVIS FINDINGS  Hepatobiliary: Diffuse hepatic steatosis. 6 mm enhancing focus in the inferior right lobe of the liver, series 2, image 65. Partially distended gallbladder. No calcified gallstone. No biliary dilatation. Pancreas: No ductal dilatation or inflammation.  No pancreatic mass. Spleen: Normal in size without focal abnormality. Adrenals/Urinary Tract: No adrenal nodule. Multifocal scarring in the left kidney. No renal calculi or hydronephrosis. No evidence of renal mass. Unremarkable urinary bladder. Stomach/Bowel: Detailed bowel assessment is limited in the absence of enteric contrast. The stomach is grossly unremarkable. No small bowel obstruction or inflammatory change. Sigmoid colon is redundant. Small to moderate volume of stool in the colon. Normal appendix visualized. No evidence of colonic wall thickening or colonic mass. Vascular/Lymphatic: Mild aortic atherosclerosis. No aortic aneurysm. The portal vein is patent. No suspicious abdominopelvic adenopathy. Reproductive: Enlarged lobulated uterus extending into the left pelvis with innumerable uterine lesions typically fibroid. The ovaries are not discretely visualized by CT. Other: No ascites. No omental thickening. Small fat containing umbilical hernia. Musculoskeletal: There are no acute or suspicious osseous abnormalities. Degenerative change of the pubic symphysis and both sacroiliac joints1. No suspicious bone lesion. IMPRESSION: 1. No explanation for weight loss or evidence of malignancy in the chest, abdomen, or pelvis. 2. Mild central bronchial thickening, can be seen with bronchitis or reactive airways disease. 3. Diffuse hepatic steatosis. 6 mm enhancing focus in the inferior right lobe of the liver, likely a flash filling hemangioma or perfusional anomaly. This is too small to characterize. 4. Enlarged lobulated uterus extending into the left pelvis with innumerable uterine lesions typically fibroids. 5. Left renal cortical scarring. Aortic  Atherosclerosis (ICD10-I70.0). Electronically Signed   By: Chadwick Colonel M.D.   On: 03/15/2024 16:35   DG Chest Portable 1 View Result Date: 03/15/2024 CLINICAL DATA:  Weakness.  History of smoking.  Question pneumonia. EXAM: PORTABLE CHEST 1 VIEW COMPARISON:  05/09/2020 FINDINGS: Cardiopericardial silhouette is at upper limits of normal for size. The lungs are clear without focal pneumonia, edema, pneumothorax or pleural effusion. No acute bony abnormality. Telemetry leads overlie the chest. IMPRESSION: No active disease. Electronically Signed   By: Donnal Fusi M.D.   On: 03/15/2024 10:19     Labs:   Basic Metabolic Panel: Recent Labs  Lab 03/15/24 0907 03/16/24 0516  NA 131* 132*  K 2.6* 3.1*  CL 92* 97*  CO2 27 26  GLUCOSE 135* 100*  BUN 14 14  CREATININE 0.99 0.95  CALCIUM 8.6* 8.1*  MG 1.5* 1.9   GFR Estimated Creatinine Clearance: 61.9 mL/min (by C-G formula based on SCr of 0.95 mg/dL). Liver Function Tests: Recent Labs  Lab 03/15/24 0907  AST 22  ALT 13  ALKPHOS 92  BILITOT 1.0  PROT 7.8  ALBUMIN 3.5   Recent Labs  Lab 03/15/24 0907  LIPASE 61*   No results for input(s): "AMMONIA" in the last 168 hours. Coagulation profile No results for input(s): "INR", "PROTIME" in the last 168 hours.  CBC: Recent Labs  Lab 03/15/24 0907 03/16/24 0516  WBC 6.4 5.2  NEUTROABS 3.4  --   HGB 10.0* 8.0*  HCT 34.5* 27.9*  MCV 67.1* 69.9*  PLT 373 282   Cardiac Enzymes: No results for input(s): "CKTOTAL", "CKMB", "  CKMBINDEX", "TROPONINI" in the last 168 hours. BNP: Invalid input(s): "POCBNP" CBG: No results for input(s): "GLUCAP" in the last 168 hours. D-Dimer No results for input(s): "DDIMER" in the last 72 hours. Hgb A1c Recent Labs    03/15/24 1506  HGBA1C 5.6   Lipid Profile No results for input(s): "CHOL", "HDL", "LDLCALC", "TRIG", "CHOLHDL", "LDLDIRECT" in the last 72 hours. Thyroid function studies Recent Labs    03/15/24 1506  TSH 0.753    Anemia work up Recent Labs    03/15/24 1047  FERRITIN 7*  TIBC 566*  IRON 29   Microbiology No results found for this or any previous visit (from the past 240 hours).   Discharge Instructions:   Discharge Instructions     Ambulatory referral to Gastroenterology   Complete by: As directed    What is the reason for referral?: Colonoscopy Comment - also with hepatic steatosis   Diet - low sodium heart healthy   Complete by: As directed    Discharge instructions   Complete by: As directed    Check BP daily and bring log to PCP   Increase activity slowly   Complete by: As directed       Allergies as of 03/16/2024   No Known Allergies      Medication List     TAKE these medications    Comfort Touch BP Cuff/Large Misc Automatic BP cuff if patient's insurance will cover   Iron 325 (65 Fe) MG Tabs Take 1 tablet (325 mg total) by mouth daily.   losartan 25 MG tablet Commonly known as: COZAAR Take 1 tablet (25 mg total) by mouth daily. Start taking on: Mar 17, 2024        Follow-up Information     [facility]. Schedule an appointment as soon as possible for a visit.   Why: Please schedule a primary care appointment to establish care                 Time coordinating discharge: 45 min  Signed:  Enrigue Harvard DO  [facility] 03/16/2024, 1:23 PM

## 2024-03-16 NOTE — TOC Transition Note (Signed)
 Transition of Care [facility]) - Discharge Note   Patient Details  Name: Crystal Webster MRN: [id number] Date of Birth: 07-25-1976  [facility] CM/SW Contact:  Marty Sleet, LCSW Phone Number: 03/16/2024, 11:34 AM   Clinical Narrative:    Met with pt to discuss need for PCP. Pt denies having PCP. Pt reports she was not aware she had insurance. CSW verified Medicaid in system and provided print out of insurance information. Pt open to scheduling PCP appt. List of [place] PCP's provided to pt. Pt to call to schedule appointment. No further TOC needs identified.      Barriers to Discharge: No Barriers Identified   Patient Goals and CMS Choice Patient states their goals for this hospitalization and ongoing recovery are:: To return home CMS Medicare.gov Compare Post Acute Care list provided to:: Patient Choice offered to / list presented to : Patient [place] ownership interest in [facility].provided to::  (NA)    Discharge Placement                       Discharge Plan and Services Additional resources added to the After Visit Summary for                  DME Arranged: N/A DME Agency: NA                  Social Drivers of Health (SDOH) Interventions SDOH Screenings   Food Insecurity: No Food Insecurity (03/15/2024)  Housing: Low Risk  (03/15/2024)  Transportation Needs: No Transportation Needs (03/15/2024)  Utilities: Not At Risk (03/15/2024)  Tobacco Use: High Risk (03/15/2024)     Readmission Risk Interventions     No data to display

## 2024-03-16 NOTE — Progress Notes (Signed)
 Discharge medication delivered to Enrique Harvest RN D Rosevelt Constable RN

## 2024-03-16 NOTE — Plan of Care (Signed)

## 2024-03-20 ENCOUNTER — Ambulatory Visit (INDEPENDENT_AMBULATORY_CARE_PROVIDER_SITE_OTHER): Admitting: Physician Assistant

## 2024-03-20 ENCOUNTER — Encounter: Payer: Self-pay | Admitting: Physician Assistant

## 2024-03-20 VITALS — BP 121/79 | HR 79 | Temp 97.4°F | Resp 16 | Ht 63.0 in | Wt 157.8 lb

## 2024-03-20 DIAGNOSIS — E876 Hypokalemia: Secondary | ICD-10-CM | POA: Diagnosis not present

## 2024-03-20 DIAGNOSIS — Z09 Encounter for follow-up examination after completed treatment for conditions other than malignant neoplasm: Secondary | ICD-10-CM

## 2024-03-20 DIAGNOSIS — I10 Essential (primary) hypertension: Secondary | ICD-10-CM | POA: Diagnosis not present

## 2024-03-20 DIAGNOSIS — D508 Other iron deficiency anemias: Secondary | ICD-10-CM | POA: Diagnosis not present

## 2024-03-20 MED ORDER — IRON 325 (65 FE) MG PO TABS: 325.0000 mg | ORAL_TABLET | Freq: Every day | ORAL | 3 refills | Status: DC

## 2024-03-20 MED ORDER — LOSARTAN POTASSIUM 25 MG PO TABS: 50.0000 mg | ORAL_TABLET | Freq: Every day | ORAL | 3 refills | Status: AC

## 2024-03-20 NOTE — Patient Instructions (Addendum)
 Call and make your appt with   [facility] [phone] [address] [place] [address]  Check blood pressures daily-Sit still and quiet for 5 mins before checking your blood pressure and practice deep breathing.  Crystal Webster is <130/85.  If you continue getting elevated numbers that are on average above this goal, you may increase the losartan  to 50mg .

## 2024-03-20 NOTE — Progress Notes (Signed)
 Patient ID: Crystal Webster, female   DOB: [birth year], 48 y.o.   MRN: [id number]   Crystal Webster, is a 48 y.o. female  YQM:[id number]  [id number]  DOB - 11-29-1975  Chief Complaint  Patient presents with   Medical Management of Chronic Issues       Subjective:   Crystal Webster is a 48 y.o. female here today for a follow up visit and to establish care After hospitalization 5/29-5/30/2025 for unexplained weight loss and uncontrolled blood pressure.  She was started on Losartan  25mg .  BP at home have still been in the 150s-160s/100 at home.  She denies HA/CP/SOB/dizziness.  She also started iron .  She was referred to GI and is calling today for appt.  She denies melena or hematochezia.  Appetite is ok.     From 5/29-5/30/2025 discharge summary: Crystal Webster is a 48 y.o. female with medical history significant for tobacco abuse presents with the above.   Reports about a month of 20 pound weight loss. Has lost her appetite and reports feeling full quickly so hasn't been eating much. Stool is looser than normal as well. No abdominal or other pain, no cough or fevers. Menstruates regularly, not heavy. No bloody or black stool. No dysuria. Drinks occasionally, no drugs, does smoke. Doesn't see a doctor regularly. No prescription drugs, only otc drug is occasional tylenol .   Patient to establish with PCP as she needs to have adjustment of her blood pressure medications Referral to GI for colonoscopy BMP 1 week Smoking cessation      Crystal Webster is a 48 y.o. female with medical history significant for tobacco abuse presents with the above.   Reports about a month of 20 pound weight loss. Has lost her appetite and reports feeling full quickly so hasn't been eating much. Stool is looser than normal as well. No abdominal or other pain, no cough or fevers. Menstruates regularly, not heavy. No bloody or black stool. No dysuria. Drinks occasionally, no drugs, does smoke. Doesn't see a  doctor regularly. No prescription drugs, only otc drug is occasional tylenol .   Early satiety/ Unintentional weight loss -Resolved -Reassuringly her CT scan was negative for any malignancy, it did show a uterus full of fibroids but patient denies heavy menstruation as well as fatty liver-will refer to GI for follow-up and colonoscopy    Loose stools - Resolved   Hypokalemia/ Hypomagnesemia -Replete    Microcytic anemia Likely iron  deficiency-replace iron  - Referral to GI placed       Elevated BP Likely undiagnosed htn - Start losartan  with follow-up of BMP in 1 week recreatinine and potassium No problems updated.  ALLERGIES: No Known Allergies  PAST MEDICAL HISTORY: Past Medical History:  Diagnosis Date   Ankle fracture, bimalleolar, closed 08/30/2014   Smoker     MEDICATIONS AT HOME: Prior to Admission medications   Medication Sig Start Date End Date Taking? Authorizing Provider  Blood Pressure Monitoring (OMRON 3 SERIES BP MONITOR) DEVI Use to check blood pressure as directed 03/16/24  Yes Vann, Jessica U, DO  Ferrous Sulfate  (IRON ) 325 (65 Fe) MG TABS Take 1 tablet (325 mg total) by mouth daily. 03/20/24   Hassie Lint, PA-C  losartan  (COZAAR ) 25 MG tablet Take 2 tablets (50 mg total) by mouth daily. 03/20/24   Kendre Jacinto, Stan Eans, PA-C    ROS: Neg HEENT Neg resp Neg cardiac Neg GU Neg MS Neg psych Neg neuro  Objective:   Vitals:   03/20/24 1016 03/20/24  1025  BP: (!) 143/90 121/79  Pulse: 79   Resp: 16   Temp: (!) 97.4 F (36.3 C)   TempSrc: Oral   SpO2: 98%   Weight: 157 lb 12.8 oz (71.6 kg)   Height: 5\' 3"  (1.6 m)    Exam General appearance : Awake, alert, not in any distress. Speech Clear. Not toxic looking HEENT: Atraumatic and Normocephalic Neck: Supple, no JVD. No cervical lymphadenopathy.  Chest: Good air entry bilaterally, CTAB.  No rales/rhonchi/wheezing CVS: S1 S2 regular, no murmurs.  Extremities: B/L Lower Ext shows no edema, both  legs are warm to touch Neurology: Awake alert, and oriented X 3, CN II-XII intact, Non focal Skin: No Rash  Data Review Lab Results  Component Value Date   HGBA1C 5.6 03/15/2024    Assessment & Plan   1. Hypertension, unspecified type (Primary) Check blood pressures daily-Sit still and quiet for 5 mins before checking your blood pressure and practice deep breathing.  Nile Barton is <130/85.  If you continue getting elevated numbers that are on average above this goal, you may increase the losartan  to 50mg .   - Basic Metabolic Panel - losartan  (COZAAR ) 25 MG tablet; Take 2 tablets (50 mg total) by mouth daily.  Dispense: 60 tablet; Refill: 3  2. Other iron  deficiency anemia Continue-call for GI appt.  Patient verbalizes understanding and referral already placed - Ferrous Sulfate  (IRON ) 325 (65 Fe) MG TABS; Take 1 tablet (325 mg total) by mouth daily.  Dispense: 30 tablet; Refill: 3  3. Hypokalemia - Basic Metabolic Panel  4. Encounter for examination following treatment at hospital    Return in about 3 months (around 06/20/2024) for PCP for chronic conditions.  The patient was given clear instructions to go to ER or return to medical center if symptoms don't improve, worsen or new problems develop. The patient verbalized understanding. The patient was told to call to get lab results if they haven't heard anything in the next week.      Dulce Gibbs, PA-C [facility] and [facility] [place], [place] [phone]   03/20/2024, 10:57 AM

## 2024-03-21 ENCOUNTER — Other Ambulatory Visit: Payer: Self-pay | Admitting: Physician Assistant

## 2024-03-21 ENCOUNTER — Telehealth: Payer: Self-pay | Admitting: *Deleted

## 2024-03-21 ENCOUNTER — Ambulatory Visit: Payer: Self-pay | Admitting: Physician Assistant

## 2024-03-21 DIAGNOSIS — E876 Hypokalemia: Secondary | ICD-10-CM

## 2024-03-21 LAB — BASIC METABOLIC PANEL WITH GFR
BUN/Creatinine Ratio: 9 (ref 9–23)
BUN: 6 mg/dL (ref 6–24)
CO2: 23 mmol/L (ref 20–29)
Calcium: 8.5 mg/dL — ABNORMAL LOW (ref 8.7–10.2)
Chloride: 101 mmol/L (ref 96–106)
Creatinine, Ser: 0.7 mg/dL (ref 0.57–1.00)
Sodium: 140 mmol/L (ref 134–144)
eGFR: 106 mL/min/{1.73_m2} (ref >59)

## 2024-03-21 NOTE — Telephone Encounter (Signed)
 I have attempted without success to contact this patient by phone to return their call and I left a message on answering machine.    Letter in front  for pick-up

## 2024-04-30 ENCOUNTER — Ambulatory Visit

## 2024-05-01 ENCOUNTER — Ambulatory Visit

## 2024-05-07 ENCOUNTER — Telehealth: Payer: Self-pay

## 2024-05-07 ENCOUNTER — Ambulatory Visit

## 2024-05-07 NOTE — Telephone Encounter (Signed)
 Called pt and left vm to call office back to reschedule missed appt

## 2024-05-08 NOTE — Telephone Encounter (Signed)
 Patient missed appt because she moved to [place] .  She will not be back at this office.  She will be requesting medical records, etc. when she finds a new provider and pharmacy.

## 2024-05-08 NOTE — Telephone Encounter (Signed)
 noted

## 2024-06-25 ENCOUNTER — Ambulatory Visit

## 2024-07-12 ENCOUNTER — Ambulatory Visit

## 2024-08-20 NOTE — Progress Notes (Unsigned)
 08/21/2024 Crystal Webster [id number] Mar 07, 1976  Referring provider: Leavy Lucas Fox, PA* Primary GI doctor: Dr. Charlanne  ASSESSMENT AND PLAN:  Microcytic anemia 05/23/2024 Hgb 8.2, MCV 80 MCH 25 platelets 291 03/15/2024 ferritin 7, iron  29 saturation 5 CTAP W enlarged lobulated uterus extending left pelvis She is on iron  supplement, tolerating it well Menses continuous since summer, with clots and going through 2-4 pads per hour, has not seen GYN Has dizziness, fatigue, no SOB/CP Most likely secondary to menorrhagia need to rule out GI causes - Referred to GYN for evaluation and management of uterine fibroids. - Rechecked iron  and ferritin levels. - Repeated CBC. - Scheduled colonoscopy and upper endoscopy to rule out other GI causes of anemia. - Consider increasing iron  supplementation to twice daily or IV iron  if oral supplementation is insufficient  Loose stools, decreased appetite with weight loss 03/15/24 CT chest abdomen pelvis with contrast mild central bronchial thickening seen with bronchitis or reactive airway, diffuse hepatic steatosis 6 mm enhancing focus inferior right lobe too small to characterize likely flash filling hemangioma, enlarged lobulated uterus extending into the left pelvis with innumerable uterine lesions typically fibroids Denies GERD, nausea, vomiting, dysphagia Still feels like she is having weight loss but our scale states it is stable Denies further diarrhea, has BM every day or every other day Lower AB cramping with menses, has decreased appetite - Scheduled colonoscopy and upper endoscopy to evaluate for potential GI causes. - Checked for H. pylori and celiac disease. - Prescribed medication at night for reflux or heartburn to potentially improve appetite. - Monitor weight and appetite.   Hypokalemia 03/15/2024 potassium 2.6 Recheck potassium  Hepatic Steatosis seen on imaging without LFT elevation Hepatic steatosis seen on CT 02/2024     Latest Ref Rng & Units 03/15/2024    9:07 AM  Hepatic Function  Total Protein 6.5 - 8.1 g/dL 7.8   Albumin 3.5 - 5.0 g/dL 3.5   AST 15 - 41 U/L 22   ALT 0 - 44 U/L 13   Alk Phosphatase 38 - 126 U/L 92   Total Bilirubin 0.0 - 1.2 mg/dL 1.0    Platelets 717  - Check liver enzymes - If enzymes elevated, may consider additional serologic work-up to rule out concomitant liver disease - Depending on above evaluation and FIB-4, can consider ultrasound elastography to follow-up  - need LFTs and CBC monitored every 6 months, - evaluation with imaging every 2-3 years.  - Encouraged diet/exercise for modest 10% body weight loss as treatment for hepatic steatosis -Continue to work on risk factor modification including diet exercise and control of risk factors including blood sugars.   Patient Care Team: Leavy Lucas, Fox RIGGERS as PCP - General (Physician Assistant)  HISTORY OF PRESENT ILLNESS: 48 y.o. female with a past medical history listed below presents for evaluation of IDA, loose stools, weight loss.   Patient had ER visit 03/16/2024 for 20 pound weight loss loss of appetite, early satiety loose stools.   Denies melena or hematochezia.  Discussed the use of AI scribe software for clinical note transcription with the patient, who gave verbal consent to proceed.  History of Present Illness   Crystal Webster is a 48 year old female who presents with menorrhagia and anemia.  She has been experiencing continuous heavy menstrual bleeding since the summer, using approximately two pads per hour. A CT scan in May revealed large fibroids.  In May, she visited the emergency room due to loss  of appetite, loose stools, and a 20-pound weight loss. A CT scan at that time showed a very fatty liver and a large uterus with fibroids, but no bowel inflammation. She was found to be very anemic with a hemoglobin of 8.2 as of August 6, and she is currently taking an iron  supplement once daily.  She  experiences abdominal pain associated with the bleeding, described as cramp-like pain that has calmed down significantly. She feels tired, weak, and dizzy, which she attributes to not eating properly due to a fluctuating appetite.  Her bowel movements have improved since May, with no more loose stools, and she now has bowel movements every other day. No blood in stool, dark black stool, nausea, heartburn, vomiting, or trouble swallowing. She denies any upper gastrointestinal symptoms and has no family history of colon cancer or autoimmune diseases like Crohn's disease.  She does not consume alcohol or use drugs, but she smokes tobacco. She denies taking any NSAIDs or experiencing any rashes or joint pain. She reports a cold with some wheezing, but no shortness of breath or chest pain.      She  reports that she has been smoking cigarettes. She has a 20 pack-year smoking history. She has never used smokeless tobacco. She reports that she does not currently use drugs. She reports that she does not drink alcohol.  RELEVANT GI HISTORY, IMAGING AND LABS: Results   LABS Hb: 8.2 (05/23/2024) Potassium: Low (02/2024)  RADIOLOGY CT scan: Hepatic steatosis, enlarged uterus with fibroids, no bowel inflammation (02/2024)      CBC    Component Value Date/Time   WBC 5.2 03/16/2024 0516   RBC 3.99 03/16/2024 0516   HGB 8.0 (L) 03/16/2024 0516   HCT 27.9 (L) 03/16/2024 0516   PLT 282 03/16/2024 0516   MCV 69.9 (L) 03/16/2024 0516   MCH 20.1 (L) 03/16/2024 0516   MCHC 28.7 (L) 03/16/2024 0516   RDW 20.8 (H) 03/16/2024 0516   LYMPHSABS 2.1 03/15/2024 0907   MONOABS 0.5 03/15/2024 0907   EOSABS 0.4 03/15/2024 0907   BASOSABS 0.0 03/15/2024 0907   Recent Labs    03/15/24 0907 03/16/24 0516  HGB 10.0* 8.0*    CMP     Component Value Date/Time   NA 140 03/20/2024 1058   K CANCELED 03/20/2024 1058   CL 101 03/20/2024 1058   CO2 23 03/20/2024 1058   GLUCOSE CANCELED 03/20/2024 1058    GLUCOSE 100 (H) 03/16/2024 0516   BUN 6 03/20/2024 1058   CREATININE 0.70 03/20/2024 1058   CALCIUM 8.5 (L) 03/20/2024 1058   PROT 7.8 03/15/2024 0907   ALBUMIN 3.5 03/15/2024 0907   AST 22 03/15/2024 0907   ALT 13 03/15/2024 0907   ALKPHOS 92 03/15/2024 0907   BILITOT 1.0 03/15/2024 0907   GFRNONAA >60 03/16/2024 0516      Latest Ref Rng & Units 03/15/2024    9:07 AM  Hepatic Function  Total Protein 6.5 - 8.1 g/dL 7.8   Albumin 3.5 - 5.0 g/dL 3.5   AST 15 - 41 U/L 22   ALT 0 - 44 U/L 13   Alk Phosphatase 38 - 126 U/L 92   Total Bilirubin 0.0 - 1.2 mg/dL 1.0       Current Medications:    Current Outpatient Medications (Cardiovascular):    losartan  (COZAAR ) 25 MG tablet, Take 2 tablets (50 mg total) by mouth daily.    Current Outpatient Medications (Hematological):    Ferrous Sulfate  (IRON ) 325 (65  Fe) MG TABS, Take 1 tablet (325 mg total) by mouth daily.  Current Outpatient Medications (Other):    Blood Pressure Monitoring (OMRON 3 SERIES BP MONITOR) DEVI, Use to check blood pressure as directed   famotidine (PEPCID) 40 MG tablet, Take 1 tablet (40 mg total) by mouth at bedtime.   Na Sulfate-K Sulfate-Mg Sulfate concentrate (SUPREP) 17.5-3.13-1.6 GM/177ML SOLN, Take 1 kit (354 mLs total) by mouth once for 1 dose.  Medical History:  Past Medical History:  Diagnosis Date   Ankle fracture, bimalleolar, closed 08/30/2014   Smoker    Uterine fibroid    Allergies: No Known Allergies   Surgical History:  She  has a past surgical history that includes No past surgeries and ORIF ankle fracture (Right, 09/06/2014). Family History:  Her family history includes Breast cancer in her maternal aunt; Lung cancer in her maternal aunt; Stomach cancer (age of onset: 80) in her cousin.  REVIEW OF SYSTEMS  : All other systems reviewed and negative except where noted in the History of Present Illness.  PHYSICAL EXAM: BP (!) 188/110   Pulse 91   Ht 5' 3 (1.6 m)   Wt 157 lb 8 oz  (71.4 kg)   BMI 27.90 kg/m  Physical Exam   MEASUREMENTS: Weight- 157. GENERAL APPEARANCE: Well nourished, in no apparent distress. HEENT: No cervical lymphadenopathy, unremarkable thyroid , sclerae anicteric, conjunctiva pink. RESPIRATORY: Respiratory effort normal, breath sounds equal bilaterally with inspiratory wheeze. CARDIO: Regular rate and rhythm with no murmurs, rubs, or gallops, peripheral pulses intact. ABDOMEN: Soft, non-distended, active bowel sounds in all four quadrants, no tenderness to palpation, no rebound, large fibroid palpable in the uterus. RECTAL: Declines. MUSCULOSKELETAL: Full range of motion, normal gait, without edema. SKIN: Dry, intact without rashes or lesions. No jaundice. NEURO: Alert, oriented, no focal deficits. PSYCH: Cooperative, normal mood and affect.      Alan JONELLE Coombs, PA-C 11:37 AM

## 2024-08-21 ENCOUNTER — Other Ambulatory Visit (INDEPENDENT_AMBULATORY_CARE_PROVIDER_SITE_OTHER)

## 2024-08-21 ENCOUNTER — Ambulatory Visit: Admitting: Physician Assistant

## 2024-08-21 ENCOUNTER — Encounter: Payer: Self-pay | Admitting: Physician Assistant

## 2024-08-21 ENCOUNTER — Telehealth: Payer: Self-pay

## 2024-08-21 VITALS — BP 188/110 | HR 91 | Ht 63.0 in | Wt 157.5 lb

## 2024-08-21 DIAGNOSIS — D509 Iron deficiency anemia, unspecified: Secondary | ICD-10-CM

## 2024-08-21 DIAGNOSIS — R195 Other fecal abnormalities: Secondary | ICD-10-CM

## 2024-08-21 DIAGNOSIS — R634 Abnormal weight loss: Secondary | ICD-10-CM

## 2024-08-21 DIAGNOSIS — D219 Benign neoplasm of connective and other soft tissue, unspecified: Secondary | ICD-10-CM | POA: Diagnosis not present

## 2024-08-21 DIAGNOSIS — R630 Anorexia: Secondary | ICD-10-CM

## 2024-08-21 DIAGNOSIS — K760 Fatty (change of) liver, not elsewhere classified: Secondary | ICD-10-CM

## 2024-08-21 DIAGNOSIS — E876 Hypokalemia: Secondary | ICD-10-CM

## 2024-08-21 LAB — CBC WITH DIFFERENTIAL/PLATELET
Basophils Absolute: 0 K/uL (ref 0.0–0.1)
Basophils Relative: 1.1 % (ref 0.0–3.0)
Eosinophils Absolute: 0.2 K/uL (ref 0.0–0.7)
Eosinophils Relative: 5.9 % — ABNORMAL HIGH (ref 0.0–5.0)
HCT: 31.8 % — ABNORMAL LOW (ref 36.0–46.0)
Hemoglobin: 9.8 g/dL — ABNORMAL LOW (ref 12.0–15.0)
Lymphocytes Relative: 27.4 % (ref 12.0–46.0)
Lymphs Abs: 1.1 K/uL (ref 0.7–4.0)
MCHC: 30.9 g/dL (ref 30.0–36.0)
MCV: 75.1 fl — ABNORMAL LOW (ref 78.0–100.0)
Monocytes Absolute: 0.5 K/uL (ref 0.1–1.0)
Monocytes Relative: 13.3 % — ABNORMAL HIGH (ref 3.0–12.0)
Neutro Abs: 2.1 K/uL (ref 1.4–7.7)
Neutrophils Relative %: 52.3 % (ref 43.0–77.0)
Platelets: 409 K/uL — ABNORMAL HIGH (ref 150.0–400.0)
RBC: 4.23 Mil/uL (ref 3.87–5.11)
RDW: 20.7 % — ABNORMAL HIGH (ref 11.5–15.5)
WBC: 3.9 K/uL — ABNORMAL LOW (ref 4.0–10.5)

## 2024-08-21 LAB — COMPREHENSIVE METABOLIC PANEL WITH GFR
ALT: 10 U/L (ref 0–35)
AST: 19 U/L (ref 0–37)
Albumin: 4.2 g/dL (ref 3.5–5.2)
Alkaline Phosphatase: 65 U/L (ref 39–117)
BUN: 9 mg/dL (ref 6–23)
CO2: 28 meq/L (ref 19–32)
Calcium: 9.3 mg/dL (ref 8.4–10.5)
Chloride: 96 meq/L (ref 96–112)
Creatinine, Ser: 0.86 mg/dL (ref 0.40–1.20)
GFR: 79.2 mL/min (ref >60.00)
Glucose, Bld: 84 mg/dL (ref 70–99)
Potassium: 2.8 meq/L — CL (ref 3.5–5.1)
Sodium: 134 meq/L — ABNORMAL LOW (ref 135–145)
Total Bilirubin: 0.5 mg/dL (ref 0.2–1.2)
Total Protein: 8.7 g/dL — ABNORMAL HIGH (ref 6.0–8.3)

## 2024-08-21 MED ORDER — FAMOTIDINE 40 MG PO TABS: 40.0000 mg | ORAL_TABLET | Freq: Every day | ORAL | 2 refills | Status: DC

## 2024-08-21 MED ORDER — POTASSIUM CHLORIDE CRYS ER 20 MEQ PO TBCR: EXTENDED_RELEASE_TABLET | ORAL | 0 refills | Status: DC

## 2024-08-21 MED ORDER — NA SULFATE-K SULFATE-MG SULF 17.5-3.13-1.6 GM/177ML PO SOLN: 1.0000 | Freq: Once | ORAL | 0 refills | Status: AC

## 2024-08-21 NOTE — Telephone Encounter (Signed)
 Called & spoke with patient regarding critical potassium level & recommendations. Patient is aware that we have sent in a prescription for Potassium that she will need to take & we will repeat labs in 1 week. Patient does not MyChart set up yet, but I have text her a link to create account so that we can send chart with potassium rich foods. Will mail a copy in the interim. I have reviewed ER precautions with patient as noted below. Patient verbalized understanding & had no concerns at the end of the call.  Kdur prescription sent to pharmacy on file.   Lab orders & reminder in epic.

## 2024-08-21 NOTE — Addendum Note (Signed)
 Addended by: Samanatha Brammer N on: 08/21/2024 04:47 PM   Modules accepted: Orders

## 2024-08-21 NOTE — Patient Instructions (Addendum)
 Your provider has requested that you go to the basement level for lab work before leaving today. Press B on the elevator. The lab is located at the first door on the left as you exit the elevator.  We have referred you to GYN and they will contact you with an appointment.  Metabolic dysfunction associated seatohepatitis  Now the leading cause of liver failure in the [place] .  It is normally from such risk factors as obesity, diabetes, insulin resistance, high cholesterol, or metabolic syndrome.  The only definitive therapy is weight loss and exercise.   Suggest walking 20-30 mins daily.  Decreasing carbohydrates, increasing veggies.    Fatty Liver Fatty liver is the accumulation of fat in liver cells. It is also called hepatosteatosis or steatohepatitis. It is normal for your liver to contain some fat. If fat is more than 5 to 10% of your liver's weight, you have fatty liver.  There are often no symptoms (problems) for years while damage is still occurring. People often learn about their fatty liver when they have medical tests for other reasons. Fat can damage your liver for years or even decades without causing problems. When it becomes severe, it can cause fatigue, weight loss, weakness, and confusion. This makes you more likely to develop more serious liver problems. The liver is the largest organ in the body. It does a lot of work and often gives no warning signs when it is sick until late in a disease. The liver has many important jobs including: Breaking down foods. Storing vitamins, iron , and other minerals. Making proteins. Making bile for food digestion. Breaking down many products including medications, alcohol and some poisons.  PROGNOSIS  Fatty liver may cause no damage or it can lead to an inflammation of the liver. This is, called steatohepatitis.  Over time the liver may become scarred and hardened. This condition is called cirrhosis. Cirrhosis is serious and may lead  to liver failure or cancer. NASH is one of the leading causes of cirrhosis. About 10-20% of Americans have fatty liver and a smaller 2-5% has NASH.  TREATMENT  Weight loss, fat restriction, and exercise in overweight patients produces inconsistent results but is worth trying. Good control of diabetes may reduce fatty liver. Eat a balanced, healthy diet. Increase your physical activity. There are no medical or surgical treatments for a fatty liver or NASH, but improving your diet and increasing your exercise may help prevent or reverse some of the damage.  You have been scheduled for an endoscopy and colonoscopy. Please follow the written instructions given to you at your visit today.  If you use inhalers (even only as needed), please bring them with you on the day of your procedure.  DO NOT TAKE 7 DAYS PRIOR TO TEST- Trulicity (dulaglutide) Ozempic, Wegovy (semaglutide) Mounjaro (tirzepatide) Bydureon Bcise (exanatide extended release)  DO NOT TAKE 1 DAY PRIOR TO YOUR TEST Rybelsus (semaglutide) Adlyxin (lixisenatide) Victoza (liraglutide) Byetta (exanatide) ___________________________________________________________________________ _______________________________________________________  If your blood pressure at your visit was 140/90 or greater, please contact your primary care physician to follow up on this.  _______________________________________________________  If you are age 70 or older, your body mass index should be between 23-30. Your Body mass index is 27.9 kg/m. If this is out of the aforementioned range listed, please consider follow up with your Primary Care Provider.  If you are age 77 or younger, your body mass index should be between 19-25. Your Body mass index is 27.9 kg/m. If this is  out of the aformentioned range listed, please consider follow up with your Primary Care Provider.   ________________________________________________________  The [facility]  providers would like to encourage you to use MYCHART to communicate with providers for non-urgent requests or questions.  Due to long hold times on the telephone, sending your provider a message by [facility] may be a faster and more efficient way to get a response.  Please allow 48 business hours for a response.  Please remember that this is for non-urgent requests.  _______________________________________________________  [facility] is using a team-based approach to care.  Your team is made up of your doctor and two to three APPS. Our APPS (Nurse Practitioners and Physician Assistants) work with your physician to ensure care continuity for you. They are fully qualified to address your health concerns and develop a treatment plan. They communicate directly with your gastroenterologist to care for you. Seeing the Advanced Practice Practitioners on your physician's team can help you by facilitating care more promptly, often allowing for earlier appointments, access to diagnostic testing, procedures, and other specialty referrals.

## 2024-08-21 NOTE — Telephone Encounter (Signed)
 Received a call from lab - Critical Potassium 2.8

## 2024-08-22 LAB — IGA: Immunoglobulin A: 236 mg/dL (ref 47–310)

## 2024-08-22 LAB — IBC + FERRITIN
Ferritin: 8.4 ng/mL — ABNORMAL LOW (ref 10.0–291.0)
Iron: 6 ug/dL — ABNORMAL LOW (ref 42–145)
Saturation Ratios: 1 % — ABNORMAL LOW (ref 20.0–50.0)
TIBC: 581 ug/dL — ABNORMAL HIGH (ref 250.0–450.0)
Transferrin: 415 mg/dL — ABNORMAL HIGH (ref 212.0–360.0)

## 2024-08-22 LAB — TISSUE TRANSGLUTAMINASE, IGA: (tTG) Ab, IgA: <1 U/mL

## 2024-08-23 ENCOUNTER — Ambulatory Visit: Payer: Self-pay | Admitting: Physician Assistant

## 2024-08-23 DIAGNOSIS — D509 Iron deficiency anemia, unspecified: Secondary | ICD-10-CM

## 2024-08-28 ENCOUNTER — Telehealth: Payer: Self-pay

## 2024-08-28 NOTE — Telephone Encounter (Signed)
-----   Message from Nurse Brumley B sent at 08/21/2024  4:40 PM EST ----- Regarding: 1-week lab reminder Labs due/orders in epic

## 2024-08-28 NOTE — Telephone Encounter (Signed)
 Lm on vm for patient to return call

## 2024-08-31 NOTE — Telephone Encounter (Signed)
 Called and spoke with patient. Patient still has been unable to pick up Potassium prescription. Patient states that she will pick up the medication tomorrow and come in for labs next Friday, 09/07/24. I informed patient that no appt is needed for lab work. Patient verbalized understanding and had no concerns at the end of the call.   Reminder in epic to contact patient on 09/05/24 for lab reminder.

## 2024-09-05 ENCOUNTER — Telehealth: Payer: Self-pay

## 2024-09-05 NOTE — Telephone Encounter (Signed)
Left message on VM for patient to return call.

## 2024-09-05 NOTE — Telephone Encounter (Signed)
 Patient returned call. Patient did pick up her Potassium prescription and started taking it on Saturday, 09/01/24. Patient states she will come in to get her lab work on Friday,  09/07/24. No further concerns at this time.

## 2024-09-05 NOTE — Telephone Encounter (Signed)
-----   Message from Nurse Velma B sent at 09/05/2024  8:40 AM EST ----- Regarding: FW: labs reminder  ----- Message ----- From: Mercer Cristino SAILOR, RN Sent: 09/05/2024  12:00 AM EST To: Cristino SAILOR Mercer, RN Subject: labs reminder                                  Potassium/mag recheck - orders are in epic. Labs due Friday, 11/21

## 2024-09-12 ENCOUNTER — Other Ambulatory Visit

## 2024-09-12 DIAGNOSIS — E876 Hypokalemia: Secondary | ICD-10-CM | POA: Diagnosis not present

## 2024-09-12 LAB — POTASSIUM: Potassium: 3.5 meq/L (ref 3.5–5.1)

## 2024-09-12 LAB — MAGNESIUM: Magnesium: 1.8 mg/dL (ref 1.5–2.5)

## 2024-09-17 ENCOUNTER — Ambulatory Visit: Payer: Self-pay | Admitting: Physician Assistant

## 2024-09-25 ENCOUNTER — Inpatient Hospital Stay

## 2024-09-25 ENCOUNTER — Inpatient Hospital Stay: Admitting: Nurse Practitioner

## 2024-09-25 ENCOUNTER — Telehealth: Payer: Self-pay

## 2024-09-25 DIAGNOSIS — D509 Iron deficiency anemia, unspecified: Secondary | ICD-10-CM

## 2024-09-25 DIAGNOSIS — E876 Hypokalemia: Secondary | ICD-10-CM

## 2024-09-25 NOTE — Telephone Encounter (Signed)
-----   Message from Nurse Gunnison B sent at 08/23/2024 10:54 AM EST ----- Regarding: lab reminder CBC & BMET due 12/11 before 12/15 procedure

## 2024-09-25 NOTE — Telephone Encounter (Signed)
Lm on vm for patient to return call. ° °Lab orders in epic. °

## 2024-09-27 NOTE — Telephone Encounter (Signed)
 Called and left patient a detailed vm letting her know that she needs to come in for labs today or tomorrow so we can make sure everything is stable before procedure on Monday. Advised patient to call back to confirm.

## 2024-09-28 NOTE — Telephone Encounter (Signed)
 Patient returned call. Patient states that she just had labs a couple of weeks ago, advised that I was aware but provider wanted to repeat labs to ensure stability before endoscopic procedures. Patient states that her insurance would not cover SUPREP and she needs an alternative. Patient states that she needed to reschedule her procedures because she works on Monday and only off on Friday's. Patient's EGD/colon have been rescheduled to Friday, 11/16/24 arriving at 2:30 pm. Telephone PV has been scheduled for Friday, 11/02/24 at 10 am. I informed patient that if nurse is not able to reach her for PV appt her procedures will be cancelled. Patient can't access MyChart and is aware that I will mail appt information. Patient provided me with address [address] in [place]. Patient also mentioned that she is having issues with constipation. Advised patient to purchase Miralax OTC and take 1-3 x a day PRN. Allowed time for additional questions. Answered all questions. Patient verbalized understanding and had no concerns at the end of the call.

## 2024-10-01 ENCOUNTER — Encounter: Admitting: Gastroenterology

## 2024-10-08 NOTE — Progress Notes (Deleted)
 " [facility]   Telephone:([phone] Fax:(336) [phone]   Clinic New consult Note   No care team member to display 10/08/2024  CHIEF COMPLAINTS/PURPOSE OF CONSULTATION:  Microcytic, iron  deficient anemia   HISTORY OF PRESENTING ILLNESS:  Crystal Webster 48 y.o. female with medical history of hypertension and menorrhagia, who is here because of microcytic anemia.  She was found to have abnormal CBC from 03/15/2024. ***She denies recent chest pain on exertion, shortness of breath on minimal exertion, pre-syncopal episodes, or palpitations. ***She had not noticed any recent bleeding such as epistaxis, hematuria or hematochezia ***The patient denies over the counter NSAID ingestion. She is not *** on antiplatelets agents. Her last colonoscopy was *** ***She had no prior history or diagnosis of cancer. Her age appropriate screening programs are up-to-date. ***She denies any pica and eats a variety of diet. ***She never donated blood or received blood transfusion ***The patient was prescribed oral iron  supplements and she takes ***   REVIEW OF SYSTEMS:   Constitutional: Denies fevers, chills or abnormal night sweats Eyes: Denies blurriness of vision, double vision or watery eyes Ears, nose, mouth, throat, and face: Denies mucositis or sore throat Respiratory: Denies cough, dyspnea or wheezes Cardiovascular: Denies palpitation, chest discomfort or lower extremity swelling Gastrointestinal:  Denies nausea, heartburn or change in bowel habits Skin: Denies abnormal skin rashes Lymphatics: Denies new lymphadenopathy or easy bruising Neurological:Denies numbness, tingling or new weaknesses Behavioral/Psych: Mood is stable, no new changes   All other systems were reviewed with the patient and are negative.   MEDICAL HISTORY:  Past Medical History:  Diagnosis Date   Ankle fracture, bimalleolar, closed 08/30/2014   Smoker    Uterine fibroid     SURGICAL  HISTORY: Past Surgical History:  Procedure Laterality Date   NO PAST SURGERIES     ORIF ANKLE FRACTURE Right 09/06/2014   Procedure: OPEN REDUCTION INTERNAL FIXATION (ORIF) BIMALLEOLAR ANKLE FRACTURE;  Surgeon: Oneil JAYSON Herald, MD;  Location: [facility] OR;  Service: Orthopedics;  Laterality: Right;    SOCIAL HISTORY: Social History   Socioeconomic History   Marital status: Single    Spouse name: Not on file   Number of children: Not on file   Years of education: Not on file   Highest education level: Not on file  Occupational History   Not on file  Tobacco Use   Smoking status: Every Day    Current packs/day: 1.00    Average packs/day: 1 pack/day for 20.0 years (20.0 ttl pk-yrs)    Types: Cigarettes   Smokeless tobacco: Never  Vaping Use   Vaping status: Never Used  Substance and Sexual Activity   Alcohol use: No   Drug use: Not Currently    Comment: occassionally   Sexual activity: Yes    Birth control/protection: Condom  Other Topics Concern   Not on file  Social History Narrative   Not on file   Social Drivers of Health   Tobacco Use: High Risk (08/21/2024)   Patient History    Smoking Tobacco Use: Every Day    Smokeless Tobacco Use: Never    Passive Exposure: Not on file  Financial Resource Strain: Medium Risk (03/20/2024)   Overall Financial Resource Strain (CARDIA)    Difficulty of Paying Living Expenses: Somewhat hard  Food Insecurity: No Food Insecurity (03/15/2024)   Hunger Vital Sign    Worried About Running Out of Food in the Last Year: Never true    Ran Out of Food in  the Last Year: Never true  Transportation Needs: No Transportation Needs (03/15/2024)   PRAPARE - Administrator, Civil Service (Medical): No    Lack of Transportation (Non-Medical): No  Physical Activity: Inactive (03/20/2024)   Exercise Vital Sign    Days of Exercise per Week: 0 days    Minutes of Exercise per Session: 0 min  Stress: No Stress Concern Present (03/20/2024)   [employer] - Occupational Stress Questionnaire    Feeling of Stress : Not at all  Social Connections: Unknown (03/20/2024)   Social Connection and Isolation Panel    Frequency of Communication with Friends and Family: Twice a week    Frequency of Social Gatherings with Friends and Family: Three times a week    Attends Religious Services: 1 to 4 times per year    Active Member of Clubs or Organizations: No    Attends Banker Meetings: 1 to 4 times per year    Marital Status: Patient unable to answer  Intimate Partner Violence: Not At Risk (03/20/2024)   Humiliation, Afraid, Rape, and Kick questionnaire    Fear of Current or Ex-Partner: No    Emotionally Abused: No    Physically Abused: No    Sexually Abused: No  Depression (PHQ2-9): Low Risk (03/20/2024)   Depression (PHQ2-9)    PHQ-2 Score: 0  Alcohol Screen: Low Risk (03/20/2024)   Alcohol Screen    Last Alcohol Screening Score (AUDIT): 0  Housing: High Risk (03/20/2024)   Housing Stability Vital Sign    Unable to Pay for Housing in the Last Year: Yes    Number of Times Moved in the Last Year: 0    Homeless in the Last Year: No  Utilities: Not At Risk (03/15/2024)   AHC Utilities    Threatened with loss of utilities: No  Health Literacy: Adequate Health Literacy (03/20/2024)   B1300 Health Literacy    Frequency of need for help with medical instructions: Never    FAMILY HISTORY: Family History  Problem Relation Age of Onset   Breast cancer Maternal Aunt    Lung cancer Maternal Aunt    Stomach cancer Cousin 30       stage 4   Colon cancer Neg Hx     ALLERGIES:  has no known allergies.  MEDICATIONS:  Current Outpatient Medications  Medication Sig Dispense Refill   Blood Pressure Monitoring (OMRON 3 SERIES BP MONITOR) DEVI Use to check blood pressure as directed 1 each 0   famotidine  (PEPCID ) 40 MG tablet Take 1 tablet (40 mg total) by mouth at bedtime. 30 tablet 2   Ferrous Sulfate  (IRON ) 325  (65 Fe) MG TABS Take 1 tablet (325 mg total) by mouth daily. 30 tablet 3   losartan  (COZAAR ) 25 MG tablet Take 2 tablets (50 mg total) by mouth daily. 60 tablet 3   potassium chloride  SA (KLOR-CON  M) 20 MEQ tablet Take 1 tablet (20 mEq total) by mouth 2 (two) times daily for 5 days, THEN 1 tablet (20 mEq total) daily for 2 days. 12 tablet 0   No current facility-administered medications for this visit.    PHYSICAL EXAMINATION: ECOG PERFORMANCE STATUS: {CHL ONC ECOG [phone]}  There were no vitals filed for this visit. There were no vitals filed for this visit.  GENERAL:alert, no distress and comfortable SKIN: skin color, texture, turgor are normal, no rashes or significant lesions EYES: normal, conjunctiva are pink and non-injected, sclera clear OROPHARYNX:no exudate, no  erythema and lips, buccal mucosa, and tongue normal  NECK: supple, thyroid  normal size, non-tender, without nodularity LYMPH:  no palpable lymphadenopathy in the cervical, axillary or inguinal LUNGS: clear to auscultation and percussion with normal breathing effort HEART: regular rate & rhythm and no murmurs and no lower extremity edema ABDOMEN:abdomen soft, non-tender and normal bowel sounds Musculoskeletal:no cyanosis of digits and no clubbing  PSYCH: alert & oriented x 3 with fluent speech NEURO: no focal motor/sensory deficits  LABORATORY DATA:  I have reviewed the data as listed    Latest Ref Rng & Units 08/21/2024   11:55 AM 03/16/2024    5:16 AM 03/15/2024    9:07 AM  CBC  WBC 4.0 - 10.5 K/uL 3.9  5.2  6.4   Hemoglobin 12.0 - 15.0 g/dL 9.8  8.0  89.9   Hematocrit 36.0 - 46.0 % 31.8  27.9  34.5   Platelets 150.0 - 400.0 K/uL 409.0  282  373        Latest Ref Rng & Units 09/12/2024   10:47 AM 08/21/2024   11:55 AM 03/20/2024   10:58 AM  CMP  Glucose 70 - 99 mg/dL  84  CANCELED   BUN 6 - 23 mg/dL  9  6   Creatinine 9.59 - 1.20 mg/dL  9.13  9.29   Sodium 864 - 145 mEq/L  134  140   Potassium 3.5 -  5.1 mEq/L 3.5  2.8  CANCELED   Chloride 96 - 112 mEq/L  96  101   CO2 19 - 32 mEq/L  28  23   Calcium 8.4 - 10.5 mg/dL  9.3  8.5   Total Protein 6.0 - 8.3 g/dL  8.7    Total Bilirubin 0.2 - 1.2 mg/dL  0.5    Alkaline Phos 39 - 117 U/L  65    AST 0 - 37 U/L  19    ALT 0 - 35 U/L  10       RADIOGRAPHIC STUDIES: I have personally reviewed the radiological images as listed and agreed with the findings in the report. No results found.  No orders of the defined types were placed in this encounter.   All questions were answered. The patient knows to call the clinic with any problems, questions or concerns. The total time spent in the appointment was {CHL ONC TIME VISIT - DTPQU:[id number]}.     Powell FORBES Lessen, NP 10/08/2024 4:39 PM  I, Izetta Neither, am acting as scribe for Onita Mattock, MD.   {Add scribe attestation statement}  "

## 2024-10-09 ENCOUNTER — Other Ambulatory Visit: Payer: Self-pay

## 2024-10-09 ENCOUNTER — Inpatient Hospital Stay: Attending: Nurse Practitioner | Admitting: Nurse Practitioner

## 2024-10-09 ENCOUNTER — Inpatient Hospital Stay

## 2024-10-09 DIAGNOSIS — D649 Anemia, unspecified: Secondary | ICD-10-CM

## 2024-10-09 NOTE — Progress Notes (Signed)
 Called patient due to not showing up for her 0900 app with Powell Lessen NP, no answer to call left a detailed message that we will no show her for today and scheduling will contact her to reschedule.

## 2024-10-15 ENCOUNTER — Other Ambulatory Visit

## 2024-10-15 DIAGNOSIS — D509 Iron deficiency anemia, unspecified: Secondary | ICD-10-CM

## 2024-10-15 DIAGNOSIS — R630 Anorexia: Secondary | ICD-10-CM

## 2024-10-16 LAB — HELICOBACTER PYLORI  SPECIAL ANTIGEN
MICRO NUMBER:: 17404457
SPECIMEN QUALITY: ADEQUATE

## 2024-10-19 ENCOUNTER — Other Ambulatory Visit: Payer: Self-pay

## 2024-10-23 ENCOUNTER — Telehealth: Payer: Self-pay

## 2024-10-23 ENCOUNTER — Other Ambulatory Visit: Payer: Self-pay

## 2024-10-23 ENCOUNTER — Telehealth: Payer: Self-pay | Admitting: Nurse Practitioner

## 2024-10-23 NOTE — Telephone Encounter (Signed)
 Called to speak with the patient over the phone. As of now, the patient has not been seen in the [facility]. She has not been seen by me in the primary care setting. The patient had been asking about referral, appointment, and medication needed for colonoscopy prep. When I spoke to her, she stated that she spoke to the pharmacy and everything had been straightened out. I explained to her that she saw Alan Coombs, PA-C at [facility]. If she had further questions about the upcoming procedures, she should contact that office. She voiced understanding and all questions were answered.  -Powell Lessen, NP

## 2024-10-23 NOTE — Telephone Encounter (Signed)
 Received telephone call from the patient requesting to speak with Powell Lessen, NP regarding a Rx for a drink to prep for a colonoscopy. Let patient know that usually comes from the provider that is performing the colonoscopy. Patient stated she had spoken with Powell before about this same matter and requesting to speak with Powell Lessen, NP directly. Let patient know that I would forward her message to Powell Lessen, NP.

## 2024-11-02 ENCOUNTER — Ambulatory Visit

## 2024-11-02 VITALS — Ht 63.0 in | Wt 134.0 lb

## 2024-11-02 DIAGNOSIS — D509 Iron deficiency anemia, unspecified: Secondary | ICD-10-CM

## 2024-11-02 DIAGNOSIS — R634 Abnormal weight loss: Secondary | ICD-10-CM

## 2024-11-02 DIAGNOSIS — R630 Anorexia: Secondary | ICD-10-CM

## 2024-11-02 DIAGNOSIS — R195 Other fecal abnormalities: Secondary | ICD-10-CM

## 2024-11-02 NOTE — Progress Notes (Signed)
 Pre visit completed via phone call; Patient verified name, DOB, and address; No egg or soy allergy known to patient;  No issues known to pt with past sedation with any surgeries or procedures; Patient denies ever being told they had issues or difficulty with intubation;  No FH of Malignant Hyperthermia; Pt is not on diet pills; Pt is not on home 02; Pt is not on blood thinners;  Pt denies issues with constipation;  No A fib or A flutter; Have any cardiac testing pending--NO Insurance verified during PV appt--- [place] Healthy Blue Pt can ambulate without assistance;  Pt denies use of chewing tobacco; Discussed diabetic/weight loss medication holds; Discussed NSAID holds; Checked BMI to be less than 50; Pt instructed to use Single[employer]com or GoodRx for a price reduction on prep; RX not sent in as RX was sent in at [facility] in 08/2024 and pt did not pick up; advised patient to call pharmacy and ask them to fill RX; pt verbalized understanding; Patient's chart reviewed by Norleen Schillings CNRA prior to previsit and patient appropriate for the [facility];  Pre visit completed and red dot placed by patient's name on their procedure day (on provider's schedule);    Instructions sent to MyChart per patient request as well as printed and placed with receptionist on 2nd floor for patient to pick up at her convenience;

## 2024-11-06 NOTE — Progress Notes (Signed)
 "   [facility] [phone]   Fax:(336) [phone]  CONSULT NOTE  REFERRING PHYSICIAN:  Dr. Charlanne  REASON FOR CONSULTATION:  IDA  HPI Crystal Webster is a 49 y.o. female past medical history significant for high blood pressure and ankle fracture was referred to the clinic for anemia.  She is followed by [facility] gastroenterology. She is seen for iron  deficiency anemia.  This was felt to be secondary to menstrual bleeding but GI evaluated her to rule out GI causes.  She also had been endorsing weight loss, decreased appetite, and loose stools per there note. However, she did deny changes in bowel habits with me today or abdominal pain unless with cramping from heavy menstrual bleeding.  Of note she is supposed to have a colonoscopy next week on 11/16/2024.  Of note the patient does have fibroids.  She had a prior CT scan of the abdomen pelvis which showed enlarged lobulated uterus extending into the left pelvis with innumerable uterine lesions typically fibroids.  She is supposed to establish with gynecology on March 3.  She reports that she was first told that she had fibroids and heavy menstrual bleeding during the summertime.  It is also when she was first told that she was anemic.  She also started iron  supplements around that time.  She takes ferrous sulfate  and is compliant with this.   The patient has reported heavy menstrual bleeding which she attributes to her uterine fibroids for the last 3 days.  She reports that she is using approximately 6 medium sized pads per day.  She reports associated clots.  She states that she has been having bleeding continuously since the summertime although it is not always heavy.  She states that since she has reading frequently that she is unclear which bleeding is attributed to her menstrual cycle or not.  She has not received blood transfusions. She also takes antihypertensive medication and potassium supplements inconsistently.  Recent laboratory testing revealed a hemoglobin of 4.9 g/dL and hypokalemia. She describes profound fatigue, dizziness, feeling cold, and cramping abdominal pain during episodes of heavy bleeding, with worsening symptoms over the past three days. She reports pica. She denies shortness of breath, chest pain, palpitations, or syncope.  She reports occasional epistaxis, including a significant episode yesterday, typically occurring in hot weather. She denies gum bleeding, melena, hematochezia, abnormal bruising, blood donation, use of anticoagulants, history of bariatric surgery, or other vitamin deficiencies.  Her family history consists of a cousin who had stomach cancer diagnosed in her 6s and an aunt with breast cancer.  Otherwise denies any family history of cancer or blood problems.  The patient works as a LAWYER.  She is single.  He has 2 children.  She is a current smoker.  Denies any alcohol or drug use.   HPI  Past Medical History:  Diagnosis Date   Ankle fracture, bimalleolar, closed 08/30/2014   Smoker    Uterine fibroid     Past Surgical History:  Procedure Laterality Date   ORIF ANKLE FRACTURE Right 09/06/2014   Procedure: OPEN REDUCTION INTERNAL FIXATION (ORIF) BIMALLEOLAR ANKLE FRACTURE;  Surgeon: Oneil JAYSON Herald, MD;  Location: [facility] OR;  Service: Orthopedics;  Laterality: Right;    Family History  Problem Relation Age of Onset   Breast cancer Maternal Aunt    Lung cancer Maternal Aunt    Stomach cancer Cousin 30       stage 4   Colon cancer Neg Hx  Colon polyps Neg Hx    Esophageal cancer Neg Hx    Rectal cancer Neg Hx     Social History Social History[1]  Allergies[2]  Current Outpatient Medications  Medication Sig Dispense Refill   Blood Pressure Monitoring (OMRON 3 SERIES BP MONITOR) DEVI Use to check blood pressure as directed 1 each 0   famotidine  (PEPCID ) 40 MG tablet Take 1 tablet (40 mg total) by mouth at bedtime. 30 tablet 2   Ferrous Sulfate  (IRON ) 325  (65 Fe) MG TABS Take 1 tablet (325 mg total) by mouth daily. 30 tablet 3   losartan  (COZAAR ) 25 MG tablet Take 2 tablets (50 mg total) by mouth daily. 60 tablet 3   losartan  (COZAAR ) 50 MG tablet Take 50 mg by mouth daily. (Patient not taking: Reported on 11/02/2024)     Na Sulfate-K Sulfate-Mg Sulfate concentrate (SUPREP) 17.5-3.13-1.6 GM/177ML SOLN 1 kit once.     No current facility-administered medications for this visit.    REVIEW OF SYSTEMS:   Review of Systems  Constitutional: Positive for fatigue and cold intolerance.  Negative for chills and fever.  HENT: Positive for intermittent epistaxis. Negative for mouth sores, sore throat and trouble swallowing.   Eyes: Negative for eye problems and icterus.  Respiratory: Negative for cough, hemoptysis, shortness of breath and wheezing.   Cardiovascular: Negative for chest pain and leg swelling.  Gastrointestinal: Positive for abdominal cramping with uterine bleeding. Negative for constipation, diarrhea, nausea and vomiting.  Genitourinary: Positive for menorrhagia. Negative for bladder incontinence, difficulty urinating, dysuria, frequency and hematuria.   Musculoskeletal: Negative for back pain, gait problem, neck pain and neck stiffness.  Skin: Negative for itching and rash.  Neurological: Positive for lightheadedness. Negative for extremity weakness, gait problem, headaches, and seizures.  Hematological: Negative for adenopathy. Does not bruise/bleed easily.  Psychiatric/Behavioral: Negative for confusion, depression and sleep disturbance. The patient is not nervous/anxious.     PHYSICAL EXAMINATION:  Blood pressure (!) 179/76, pulse 88, temperature (!) 97.5 F (36.4 C), resp. rate 20, weight 153 lb 9.6 oz (69.7 kg), SpO2 100%.  ECOG PERFORMANCE STATUS: 1  Physical Exam  Constitutional: Oriented to person, place, and time and well-developed, well-nourished, and in no distress. HENT:  Head: Normocephalic and atraumatic.   Mouth/Throat: Oropharynx is clear and moist. No oropharyngeal exudate.  Eyes: Conjunctivae are normal. Right eye exhibits no discharge. Left eye exhibits no discharge. No scleral icterus.  Neck: Normal range of motion. Neck supple.  Cardiovascular: Normal rate, regular rhythm, normal heart sounds and intact distal pulses.   Pulmonary/Chest: Effort normal and breath sounds normal. No respiratory distress. No wheezes. No rales.  Abdominal: Soft. Bowel sounds are normal. Exhibits no distension and no mass. There is no tenderness.  Musculoskeletal: Normal range of motion. Exhibits no edema.  Lymphadenopathy:    No cervical adenopathy.  Neurological: Alert and oriented to person, place, and time. Exhibits normal muscle tone. Gait normal. Coordination normal.  Skin: Skin is warm and dry. No rash noted. Not diaphoretic. No erythema. No pallor.  Psychiatric: Mood, memory and judgment normal.  Vitals reviewed.  LABORATORY DATA: Lab Results  Component Value Date   WBC 8.5 11/09/2024   HGB 4.9 (LL) 11/09/2024   HCT 16.1 (L) 11/09/2024   MCV 69.7 (L) 11/09/2024   PLT 366 11/09/2024      Chemistry      Component Value Date/Time   NA 138 11/09/2024 1016   NA 140 03/20/2024 1058   K 2.6 (LL) 11/09/2024 1016  CL 98 11/09/2024 1016   CO2 28 11/09/2024 1016   BUN 7 11/09/2024 1016   BUN 6 03/20/2024 1058   CREATININE 0.82 11/09/2024 1016      Component Value Date/Time   CALCIUM 8.8 (L) 11/09/2024 1016   ALKPHOS 55 11/09/2024 1016   AST 19 11/09/2024 1016   ALT 8 11/09/2024 1016   BILITOT 0.4 11/09/2024 1016       RADIOGRAPHIC STUDIES: No results found.  ASSESSMENT: This is a very pleasant 49 year old African American Female with iron  deficiency anemia.   IDA -The patient was seen with Dr. Federico -- Labs ordered: CBC, CMP, iron  studies, ferritin, folate, vitamin B12, and reticulocyte panel - Results: Hgb critically low at 4.9 g/dL today. - Her iron  studies show significant IDA  with iron  <10, TIBC 533, and saturation unable to be calculated. Her ferritin is pending.  - She also has critically low potassium of 2.6.  - She likely needs 2+ units of blood given the severe anemia.  - She also needs to be evaluated for the AUB and management of the hypokalemia. Therefore, we recommended ER evaluation. If admitted, they can consider giving her a dose of IV iron  while admitted.  - We will order IV iron  with monoferrric outpatient at the [facility] next week - Follow-up: Return about 2 weeks  for follow up and close monitoring - She will continue her iron  supplement p.o. with vitamin C.I sent a refill to the pharmacy  Hypokalemia Critically low potassium with unclear etiology, increasing risk for cardiac arrhythmias. - Referred for slow IV potassium replacement and cardiac monitoring. - Instructed hospital team to correct potassium and monitor cardiac status. - Advised follow-up with primary care for potassium monitoring and etiology investigation. - Potassium supplementation as needed post-discharge and importance of compliance.  Abnormal Uterine Bleeding/Fibroids -Supposed to establish with gynecology on 12/18/24. Likely needs to be evaluated sooner -Prior CT scan shows enlarged lobulated uterus extending into the left pelvis with innumerable uterine lesions typically fibroids.  The patient voices understanding of current disease status and treatment options and is in agreement with the current care plan.  All questions were answered. The patient knows to call the clinic with any problems, questions or concerns. We can certainly see the patient much sooner if necessary.  Thank you so much for allowing me to participate in the care of [facility]. I will continue to follow up the patient with you and assist in her care.  I spent 40 minutes counseling the patient face to face. The total time spent in the appointment was 60 minutes.  Disclaimer:  This note was dictated with voice recognition software. Similar sounding words can inadvertently be transcribed and may not be corrected upon review.   Riot Barrick L Lonnette Shrode November 09, 2024, 11:59 AM   I have read the above note and personally examined the patient. I agree with the assessment and plan as noted above.  Briefly Crystal Webster is a 49 year old female who presents for evaluation of iron  deficiency anemia.  On exam today she was found to have a severe iron  deficiency anemia with a hemoglobin of 4.9.  Due to the severe anemia we recommended prompt transfer to the emergency department.  Additionally today we will check full nutritional panel to include iron , vitamin B12, and folate.  The patient does endorse having heavy menstrual cycles.  She reports she does try to eat iron  rich foods.  Patient will require at least  2 units of packed red blood cells.  Additionally would recommend IV iron  therapy while in house and we will continue IV iron  therapy in the outpatient setting.  Recommend short interval follow-up in approximately 2 weeks to assure that her hemoglobin stabilizes.  She voiced understanding of our findings and recommendations moving forward.   Norleen IVAR Kidney, MD Department of Hematology/Oncology [facility] Phone: [phone] Pager: [phone] Email: [email or web address]     [1]  Social History Tobacco Use   Smoking status: Every Day    Current packs/day: 1.00    Average packs/day: 1 pack/day for 20.0 years (20.0 ttl pk-yrs)    Types: Cigarettes   Smokeless tobacco: Never  Vaping Use   Vaping status: Never Used  Substance Use Topics   Alcohol use: No   Drug use: Not Currently    Comment: occassionally  [2] No Known Allergies  "

## 2024-11-08 ENCOUNTER — Other Ambulatory Visit: Payer: Self-pay | Admitting: Physician Assistant

## 2024-11-08 DIAGNOSIS — D509 Iron deficiency anemia, unspecified: Secondary | ICD-10-CM

## 2024-11-09 ENCOUNTER — Encounter (HOSPITAL_COMMUNITY): Payer: Self-pay

## 2024-11-09 ENCOUNTER — Telehealth: Payer: Self-pay

## 2024-11-09 ENCOUNTER — Inpatient Hospital Stay: Attending: Nurse Practitioner

## 2024-11-09 ENCOUNTER — Other Ambulatory Visit: Payer: Self-pay

## 2024-11-09 ENCOUNTER — Inpatient Hospital Stay (HOSPITAL_COMMUNITY)
Admission: EM | Admit: 2024-11-09 | Discharge: 2024-11-12 | DRG: 812 | Disposition: A | Source: Ambulatory Visit | Attending: Internal Medicine | Admitting: Internal Medicine

## 2024-11-09 ENCOUNTER — Inpatient Hospital Stay (HOSPITAL_BASED_OUTPATIENT_CLINIC_OR_DEPARTMENT_OTHER): Admitting: Physician Assistant

## 2024-11-09 VITALS — BP 179/76 | HR 88 | Temp 97.5°F | Resp 20 | Wt 153.6 lb

## 2024-11-09 DIAGNOSIS — E876 Hypokalemia: Secondary | ICD-10-CM | POA: Insufficient documentation

## 2024-11-09 DIAGNOSIS — D509 Iron deficiency anemia, unspecified: Secondary | ICD-10-CM

## 2024-11-09 DIAGNOSIS — Z800 Family history of malignant neoplasm of digestive organs: Secondary | ICD-10-CM | POA: Insufficient documentation

## 2024-11-09 DIAGNOSIS — Z79899 Other long term (current) drug therapy: Secondary | ICD-10-CM

## 2024-11-09 DIAGNOSIS — Z803 Family history of malignant neoplasm of breast: Secondary | ICD-10-CM | POA: Insufficient documentation

## 2024-11-09 DIAGNOSIS — Z8742 Personal history of other diseases of the female genital tract: Secondary | ICD-10-CM

## 2024-11-09 DIAGNOSIS — I10 Essential (primary) hypertension: Secondary | ICD-10-CM | POA: Diagnosis present

## 2024-11-09 DIAGNOSIS — D62 Acute posthemorrhagic anemia: Principal | ICD-10-CM | POA: Diagnosis present

## 2024-11-09 DIAGNOSIS — D649 Anemia, unspecified: Secondary | ICD-10-CM | POA: Diagnosis not present

## 2024-11-09 DIAGNOSIS — N930 Postcoital and contact bleeding: Secondary | ICD-10-CM | POA: Insufficient documentation

## 2024-11-09 DIAGNOSIS — N938 Other specified abnormal uterine and vaginal bleeding: Secondary | ICD-10-CM | POA: Diagnosis present

## 2024-11-09 DIAGNOSIS — D259 Leiomyoma of uterus, unspecified: Secondary | ICD-10-CM | POA: Insufficient documentation

## 2024-11-09 DIAGNOSIS — D508 Other iron deficiency anemias: Secondary | ICD-10-CM

## 2024-11-09 DIAGNOSIS — F17210 Nicotine dependence, cigarettes, uncomplicated: Secondary | ICD-10-CM | POA: Diagnosis present

## 2024-11-09 LAB — CMP (CANCER CENTER ONLY)
ALT: 8 U/L (ref 0–44)
AST: 19 U/L (ref 15–41)
Albumin: 3.9 g/dL (ref 3.5–5.0)
Alkaline Phosphatase: 55 U/L (ref 38–126)
Anion gap: 11 (ref 5–15)
BUN: 7 mg/dL (ref 6–20)
CO2: 28 mmol/L (ref 22–32)
Calcium: 8.8 mg/dL — ABNORMAL LOW (ref 8.9–10.3)
Chloride: 98 mmol/L (ref 98–111)
Creatinine: 0.82 mg/dL (ref 0.44–1.00)
GFR, Estimated: >60 mL/min
Glucose, Bld: 102 mg/dL — ABNORMAL HIGH (ref 70–99)
Potassium: 2.6 mmol/L — CL (ref 3.5–5.1)
Sodium: 138 mmol/L (ref 135–145)
Total Bilirubin: 0.4 mg/dL (ref 0.0–1.2)
Total Protein: 7.3 g/dL (ref 6.5–8.1)

## 2024-11-09 LAB — BASIC METABOLIC PANEL WITH GFR
Anion gap: 10 (ref 5–15)
BUN: 7 mg/dL (ref 6–20)
CO2: 29 mmol/L (ref 22–32)
Calcium: 8.8 mg/dL — ABNORMAL LOW (ref 8.9–10.3)
Chloride: 99 mmol/L (ref 98–111)
Creatinine, Ser: 0.78 mg/dL (ref 0.44–1.00)
GFR, Estimated: >60 mL/min
Glucose, Bld: 107 mg/dL — ABNORMAL HIGH (ref 70–99)
Potassium: 2.5 mmol/L — CL (ref 3.5–5.1)
Sodium: 137 mmol/L (ref 135–145)

## 2024-11-09 LAB — CBC
HCT: 16.4 % — ABNORMAL LOW (ref 36.0–46.0)
Hemoglobin: 4.7 g/dL — CL (ref 12.0–15.0)
MCH: 21.3 pg — ABNORMAL LOW (ref 26.0–34.0)
MCHC: 28.7 g/dL — ABNORMAL LOW (ref 30.0–36.0)
MCV: 74.2 fL — ABNORMAL LOW (ref 80.0–100.0)
Platelets: 359 K/uL (ref 150–400)
RBC: 2.21 MIL/uL — ABNORMAL LOW (ref 3.87–5.11)
RDW: 19.7 % — ABNORMAL HIGH (ref 11.5–15.5)
WBC: 7.9 K/uL (ref 4.0–10.5)
nRBC: 0.3 % — ABNORMAL HIGH (ref 0.0–0.2)

## 2024-11-09 LAB — CBC WITH DIFFERENTIAL (CANCER CENTER ONLY)
Abs Immature Granulocytes: 0.05 K/uL (ref 0.00–0.07)
Basophils Absolute: 0 K/uL (ref 0.0–0.1)
Basophils Relative: 0 %
Eosinophils Absolute: 0.3 K/uL (ref 0.0–0.5)
Eosinophils Relative: 4 %
HCT: 16.1 % — ABNORMAL LOW (ref 36.0–46.0)
Hemoglobin: 4.9 g/dL — CL (ref 12.0–15.0)
Immature Granulocytes: 1 %
Lymphocytes Relative: 26 %
Lymphs Abs: 2.2 K/uL (ref 0.7–4.0)
MCH: 21.2 pg — ABNORMAL LOW (ref 26.0–34.0)
MCHC: 30.4 g/dL (ref 30.0–36.0)
MCV: 69.7 fL — ABNORMAL LOW (ref 80.0–100.0)
Monocytes Absolute: 0.6 K/uL (ref 0.1–1.0)
Monocytes Relative: 7 %
Neutro Abs: 5.3 K/uL (ref 1.7–7.7)
Neutrophils Relative %: 62 %
Platelet Count: 366 K/uL (ref 150–400)
RBC: 2.31 MIL/uL — ABNORMAL LOW (ref 3.87–5.11)
RDW: 19.4 % — ABNORMAL HIGH (ref 11.5–15.5)
WBC Count: 8.5 K/uL (ref 4.0–10.5)
nRBC: 0.2 % (ref 0.0–0.2)

## 2024-11-09 LAB — IRON AND IRON BINDING CAPACITY (CC-WL,HP ONLY)
Iron: <10 ug/dL — ABNORMAL LOW (ref 28–170)
TIBC: 533 ug/dL — ABNORMAL HIGH (ref 250–450)

## 2024-11-09 LAB — VITAMIN B12: Vitamin B-12: 393 pg/mL (ref 180–914)

## 2024-11-09 LAB — RETIC PANEL
Immature Retic Fract: 43 % — ABNORMAL HIGH (ref 2.3–15.9)
RBC.: 2.33 MIL/uL — ABNORMAL LOW (ref 3.87–5.11)
Retic Count, Absolute: 67.3 K/uL (ref 19.0–186.0)
Retic Ct Pct: 2.9 % (ref 0.4–3.1)
Reticulocyte Hemoglobin: 19.5 pg — ABNORMAL LOW

## 2024-11-09 LAB — PREPARE RBC (CROSSMATCH)

## 2024-11-09 LAB — FERRITIN: Ferritin: 11 ng/mL (ref 11–307)

## 2024-11-09 LAB — MAGNESIUM: Magnesium: 1.9 mg/dL (ref 1.7–2.4)

## 2024-11-09 LAB — ABO/RH: ABO/RH(D): B POS

## 2024-11-09 LAB — FOLATE: Folate: 8.8 ng/mL

## 2024-11-09 MED ORDER — POTASSIUM CHLORIDE CRYS ER 20 MEQ PO TBCR
40.0000 meq | EXTENDED_RELEASE_TABLET | Freq: Once | ORAL | Status: AC
  Administered 2024-11-09: 40 meq via ORAL
  Filled 2024-11-09: qty 2

## 2024-11-09 MED ORDER — HYDRALAZINE HCL 20 MG/ML IJ SOLN
10.0000 mg | Freq: Four times a day (QID) | INTRAMUSCULAR | Status: DC | PRN
  Administered 2024-11-09 – 2024-11-11 (×2): 10 mg via INTRAVENOUS
  Filled 2024-11-09 (×2): qty 1

## 2024-11-09 MED ORDER — ACETAMINOPHEN 325 MG PO TABS: 650.0000 mg | ORAL_TABLET | Freq: Four times a day (QID) | ORAL | Status: DC | PRN

## 2024-11-09 MED ORDER — FERROUS SULFATE 325 (65 FE) MG PO TABS
325.0000 mg | ORAL_TABLET | Freq: Every day | ORAL | Status: DC
  Administered 2024-11-09 – 2024-11-12 (×4): 325 mg via ORAL
  Filled 2024-11-09 (×4): qty 1

## 2024-11-09 MED ORDER — IRON 325 (65 FE) MG PO TABS: 325.0000 mg | ORAL_TABLET | Freq: Every day | ORAL | 3 refills | Status: DC

## 2024-11-09 MED ORDER — SODIUM CHLORIDE 0.9% IV SOLUTION: Freq: Once | INTRAVENOUS | Status: AC

## 2024-11-09 MED ORDER — ACETAMINOPHEN 650 MG RE SUPP: 650.0000 mg | Freq: Four times a day (QID) | RECTAL | Status: DC | PRN

## 2024-11-09 MED ORDER — POTASSIUM CHLORIDE 10 MEQ/100ML IV SOLN
10.0000 meq | INTRAVENOUS | Status: AC
  Administered 2024-11-09 (×2): 10 meq via INTRAVENOUS
  Filled 2024-11-09 (×2): qty 100

## 2024-11-09 MED ORDER — LOSARTAN POTASSIUM 25 MG PO TABS
25.0000 mg | ORAL_TABLET | Freq: Every day | ORAL | Status: DC
  Administered 2024-11-09 – 2024-11-12 (×4): 25 mg via ORAL
  Filled 2024-11-09 (×4): qty 1

## 2024-11-09 MED ORDER — IRON SUCROSE 200 MG IVPB - SIMPLE MED
200.0000 mg | Freq: Once | Status: AC
  Administered 2024-11-10: 200 mg via INTRAVENOUS
  Filled 2024-11-09: qty 200

## 2024-11-09 MED ORDER — POTASSIUM CHLORIDE CRYS ER 20 MEQ PO TBCR
40.0000 meq | EXTENDED_RELEASE_TABLET | Freq: Three times a day (TID) | ORAL | Status: AC
  Administered 2024-11-09 – 2024-11-10 (×3): 40 meq via ORAL
  Filled 2024-11-09 (×3): qty 2

## 2024-11-09 MED ORDER — ONDANSETRON HCL 4 MG/2ML IJ SOLN
4.0000 mg | Freq: Four times a day (QID) | INTRAMUSCULAR | Status: DC | PRN
  Administered 2024-11-09: 4 mg via INTRAVENOUS

## 2024-11-09 NOTE — Telephone Encounter (Signed)
 CRITICAL VALUE STICKER  CRITICAL VALUE: Potassium 2.6   RECEIVER (on-site recipient of call): [facility]   DATE & TIME NOTIFIED: 11/09/24 @ 1106  MESSENGER (representative from lab): Hadassah   MD NOTIFIEDBETHA Drone, PA  TIME OF NOTIFICATION: 1107  RESPONSE: patient being sent to ER

## 2024-11-09 NOTE — ED Triage Notes (Signed)
 Pt also reports vaginal bleeding d/t uterine fibroids

## 2024-11-09 NOTE — ED Provider Notes (Signed)
 " [place] EMERGENCY DEPARTMENT AT [facility] Provider Note   CSN: [id number] Arrival date & time: 11/09/24  1208     Patient presents with: Abnormal Lab and Vaginal Bleeding   PAYSLIE [facility] is a 49 y.o. female.   Pt sent from clinic to ED as had outpatient labs today and was told hemoglobin low, < 5 and k low, 2.6. pt indicates  has long standing anemia, low iron , and last dysfunctional uterine bleeding/fibroids. Unsure when last period was as states generally has some amount of bleeding each week. Currently with mild vaginal bleeding, with partially saturated pad that she has changed twice so far today. No new or worsening abdominal pain. No other abnormal bruising or bleeding. No rectal bleeding or melena. No anticoagulant use. Has been on oral iron  therapy in past.  Pt feels faint/lightheaded when stands, and generally weak. No syncope.   The history is provided by the patient and medical records.  Abnormal Lab Vaginal Bleeding Associated symptoms: no abdominal pain, no back pain and no fever        Prior to Admission medications  Medication Sig Start Date End Date Taking? Authorizing Provider  Blood Pressure Monitoring (OMRON 3 SERIES BP MONITOR) DEVI Use to check blood pressure as directed 03/16/24   Juvenal Harlene PENNER, DO  famotidine  (PEPCID ) 40 MG tablet Take 1 tablet (40 mg total) by mouth at bedtime. 08/21/24   Craig Alan SAUNDERS, PA-C  Ferrous Sulfate  (IRON ) 325 (65 Fe) MG TABS Take 1 tablet (325 mg total) by mouth daily. 11/09/24   Heilingoetter, Cassandra L, PA-C  losartan  (COZAAR ) 25 MG tablet Take 2 tablets (50 mg total) by mouth daily. 03/20/24   Danton Jon HERO, PA-C  losartan  (COZAAR ) 50 MG tablet Take 50 mg by mouth daily. Patient not taking: Reported on 11/02/2024 05/23/24   [provider]  Na Sulfate-K Sulfate-Mg Sulfate concentrate (SUPREP) 17.5-3.13-1.6 GM/177ML SOLN 1 kit once. 10/23/24   [provider]    Allergies: Patient has no  known allergies.    Review of Systems  Constitutional:  Negative for chills and fever.  Respiratory:  Negative for cough and shortness of breath.   Cardiovascular:  Negative for chest pain.  Gastrointestinal:  Negative for abdominal pain, blood in stool, diarrhea and vomiting.  Genitourinary:  Positive for vaginal bleeding. Negative for flank pain.  Musculoskeletal:  Negative for back pain and neck pain.  Neurological:  Positive for light-headedness. Negative for headaches.    Updated Vital Signs BP (!) 186/86   Pulse 83   Temp 98.1 F (36.7 C) (Oral)   Resp 20   Wt 69.4 kg   LMP 11/02/2024 (Approximate)   SpO2 100%   BMI 27.10 kg/m   Physical Exam Vitals and nursing note reviewed.  Constitutional:      Appearance: Normal appearance. She is well-developed.  HENT:     Head: Atraumatic.     Nose: Nose normal.     Mouth/Throat:     Mouth: Mucous membranes are moist.  Eyes:     General: No scleral icterus.    Pupils: Pupils are equal, round, and reactive to light.     Comments: Conj pale.   Neck:     Trachea: No tracheal deviation.  Cardiovascular:     Rate and Rhythm: Normal rate and regular rhythm.     Pulses: Normal pulses.     Heart sounds: Normal heart sounds. No murmur heard.    No friction rub. No gallop.  Pulmonary:     Effort: Pulmonary effort is normal. No respiratory distress.     Breath sounds: Normal breath sounds.  Abdominal:     General: Bowel sounds are normal. There is no distension.     Palpations: Abdomen is soft.     Tenderness: There is no abdominal tenderness.  Genitourinary:    Comments: No cva tenderness.  Musculoskeletal:        General: No swelling or tenderness.     Cervical back: Normal range of motion and neck supple. No rigidity. No muscular tenderness.  Skin:    General: Skin is warm and dry.     Findings: No rash.  Neurological:     Mental Status: She is alert.     Comments: Alert, speech normal.   Psychiatric:        Mood and  Affect: Mood normal.     (all labs ordered are listed, but only abnormal results are displayed) Results for orders placed or performed during the hospital encounter of 11/09/24  CBC   Collection Time: 11/09/24 12:22 PM  Result Value Ref Range   WBC 7.9 4.0 - 10.5 K/uL   RBC 2.21 (L) 3.87 - 5.11 MIL/uL   Hemoglobin 4.7 (LL) 12.0 - 15.0 g/dL   HCT 83.5 (L) 63.9 - 53.9 %   MCV 74.2 (L) 80.0 - 100.0 fL   MCH 21.3 (L) 26.0 - 34.0 pg   MCHC 28.7 (L) 30.0 - 36.0 g/dL   RDW 80.2 (H) 88.4 - 84.4 %   Platelets 359 150 - 400 K/uL   nRBC 0.3 (H) 0.0 - 0.2 %  Basic metabolic panel with GFR   Collection Time: 11/09/24 12:22 PM  Result Value Ref Range   Sodium 137 135 - 145 mmol/L   Potassium 2.5 (LL) 3.5 - 5.1 mmol/L   Chloride 99 98 - 111 mmol/L   CO2 29 22 - 32 mmol/L   Glucose, Bld 107 (H) 70 - 99 mg/dL   BUN 7 6 - 20 mg/dL   Creatinine, Ser 9.21 0.44 - 1.00 mg/dL   Calcium 8.8 (L) 8.9 - 10.3 mg/dL   GFR, Estimated >39 >39 mL/min   Anion gap 10 5 - 15     EKG: None  Radiology: No results found.   Procedures   Medications Ordered in the ED  potassium chloride  10 mEq in 100 mL IVPB (10 mEq Intravenous New Bag/Given 11/09/24 1250)  0.9 %  sodium chloride  infusion (Manually program via Guardrails IV Fluids) (has no administration in time range)                                    Medical Decision Making Problems Addressed: Dysfunctional uterine bleeding: chronic illness or injury with exacerbation, progression, or side effects of treatment that poses a threat to life or bodily functions Hypokalemia: acute illness or injury with systemic symptoms that poses a threat to life or bodily functions Severe anemia: acute illness or injury with systemic symptoms that poses a threat to life or bodily functions    Details: Acute on chronic Symptomatic anemia: acute illness or injury with systemic symptoms that poses a threat to life or bodily functions Uterine leiomyoma, unspecified  location: chronic illness or injury with exacerbation, progression, or side effects of treatment  Amount and/or Complexity of Data Reviewed External Data Reviewed: notes. Labs: ordered. Decision-making details documented in ED Course. ECG/medicine tests: ordered and  independent interpretation performed. Decision-making details documented in ED Course. Discussion of management or test interpretation with external provider(s): medicine  Risk Prescription drug management. Decision regarding hospitalization.   Iv ns. Continuous pulse ox and cardiac monitoring. Labs ordered/sent.   Differential diagnosis includes symptomatic anemia, dysfunctional uterine bleeding, iron  deficiency, hypokalemia, etc. Dispo decision including potential need for admission considered - will get labs and reassess.   Reviewed nursing notes and prior charts for additional history. External reports reviewed.   Cardiac monitor: sinus rhythm, rate 88.  Labs reviewed/interpreted by me - hgb v low. K low. Emergent transfusion prbcs.  K iv.  Verified very low hgb 4.7 - prbc transfusion. K also verified and is low. Iv and po k.   Hospitalists consulted for admission.  CRITICAL CARE RE: severe symptomatic anemia requiring emergent prbc transfusion, severe hypoaklemia requiring parenteral k infusion.  Performed by: Tanique Matney E Averyanna Sax Total critical care time: 45 minutes Critical care time was exclusive of separately billable procedures and treating other patients. Critical care was necessary to treat or prevent imminent or life-threatening deterioration. Critical care was time spent personally by me on the following activities: development of treatment plan with patient and/or surrogate as well as nursing, discussions with consultants, evaluation of patient's response to treatment, examination of patient, obtaining history from patient or surrogate, ordering and performing treatments and interventions, ordering and review of  laboratory studies, ordering and review of radiographic studies, pulse oximetry and re-evaluation of patient's condition.         Final diagnoses:  None    ED Discharge Orders     None          Bernard Drivers, MD 11/09/24 1333  "

## 2024-11-09 NOTE — ED Triage Notes (Addendum)
 BIB nurse from [facility]/ HGB of 4.6/ pt is a new hematology pt d/t excess vaginal bleeding/ reports SHOB, weakness, dizziness, feels dehydrated. Bleeding x  3days / no transfusion hx/  pt is A&OX4

## 2024-11-09 NOTE — Assessment & Plan Note (Signed)
 Iron  deficiency anemia IDA diagnosed around May 2025 as well as CT abd/pelvis showing fibroids, and pt with hx or chronic menorrhagia.  No history to suggest GI bleeding, and pt is followed by [facility] outpatient with planned colonoscopy on 11/16/2024. Iron  studies outpatient today show severe deficiency despite pt on PO iron  supplement --2 units PRBC's ordered for transfusion in the ED --Serial H&H's --Transfuse PRBC's for goal Hbg > 7 --Iron  infusion in vs outpatient

## 2024-11-09 NOTE — H&P (Signed)
 " History and Physical    Crystal [facility] FMW:[id number] DOB: [birth year] DOA: 11/09/2024  PCP: Pcp, No  Patient coming from: home via cancer clinic   I have personally briefly reviewed patient's old medical records available.   Chief Complaint: Abnormal labs, ongoing vaginal bleeding.  HPI: Crystal [facility] is a 49 y.o. female with medical history significant of hypertension, chronic hypokalemia, abnormal uterine bleeding thought secondary to fibroid uterus and was scheduled to see gynecologist in March 3, receiving iron  transfusions at cancer center who was sent today and found to be symptomatic with progressive generalized weakness and lightheadedness.  At the clinic.  Hemoglobin was 4.7 and potassium was 2.5 so she was asked to go to the ER.  Patient tells me that she has ongoing vaginal bleeding throughout the month, sometimes uses up to 6 pads a day.  Always uses a pad since summertime.  She recently had a CT scan that showed fibroid uterus and she was referred to gynecology at that is scheduled for March.  She has been doing relatively well without any symptoms, however for last few days she has been more weak, short of breath getting out of the bed.  Feels fatigued.  Occasional lightheadedness.  Patient has been compliant with taking oral iron  and potassium.  Patient denies any GI symptoms including any hematemesis, hemoptysis, hematochezia or melena.  She is scheduled to have colonoscopy with [facility] next week. ED Course: Hemodynamically stable.  Hemoglobin 4.7.  Potassium 2.5.  Potassium replacement is started.  Sent to the hospital for blood transfusion and stabilization due to significant symptoms. Patient is due to receive 2 units of blood transfusion, she has consented. Received 40 of potassium chloride  p.o. and 30 mEq of IV potassium chloride  already on my exam.  Magnesium  is adequate.  Review of Systems: all systems are reviewed and pertinent positive as per HPI otherwise rest are  negative.    Past Medical History:  Diagnosis Date   Ankle fracture, bimalleolar, closed 08/30/2014   Smoker    Uterine fibroid     Past Surgical History:  Procedure Laterality Date   ORIF ANKLE FRACTURE Right 09/06/2014   Procedure: OPEN REDUCTION INTERNAL FIXATION (ORIF) BIMALLEOLAR ANKLE FRACTURE;  Surgeon: Oneil JAYSON Herald, MD;  Location: [facility] OR;  Service: Orthopedics;  Laterality: Right;    Social history   reports that she has been smoking cigarettes. She has a 20 pack-year smoking history. She has never used smokeless tobacco. She reports that she does not currently use drugs. She reports that she does not drink alcohol.  Allergies[1]  Family History  Problem Relation Age of Onset   Breast cancer Maternal Aunt    Lung cancer Maternal Aunt    Stomach cancer Cousin 30       stage 4   Colon cancer Neg Hx    Colon polyps Neg Hx    Esophageal cancer Neg Hx    Rectal cancer Neg Hx      Prior to Admission medications  Medication Sig Start Date End Date Taking? Authorizing Provider  Ferrous Sulfate  (IRON ) 325 (65 Fe) MG TABS Take 1 tablet (325 mg total) by mouth daily. 11/09/24  Yes Heilingoetter, Cassandra L, PA-C  losartan  (COZAAR ) 25 MG tablet Take 2 tablets (50 mg total) by mouth daily. Patient taking differently: Take 25 mg by mouth daily. 03/20/24  Yes Danton Jon HERO, PA-C  Blood Pressure Monitoring (OMRON 3 SERIES BP MONITOR) DEVI Use to check blood pressure as directed 03/16/24  Vann, Jessica U, DO  famotidine  (PEPCID ) 40 MG tablet Take 1 tablet (40 mg total) by mouth at bedtime. Patient not taking: Reported on 11/09/2024 08/21/24   Craig Alan SAUNDERS, PA-C  Na Sulfate-K Sulfate-Mg Sulfate concentrate (SUPREP) 17.5-3.13-1.6 GM/177ML SOLN 1 kit once. Patient not taking: Reported on 11/09/2024 10/23/24   [provider]    Physical Exam: Vitals:   11/09/24 1330 11/09/24 1445 11/09/24 1500 11/09/24 1532  BP:  (!) 185/96 (!) 141/81 135/76  Pulse: 81 81 92 97  Resp:  17 15 18 19   Temp:    98.2 F (36.8 C)  TempSrc:    Oral  SpO2: 100% 100% 100% 100%  Weight:        Constitutional: NAD, calm, comfortable Vitals:   11/09/24 1330 11/09/24 1445 11/09/24 1500 11/09/24 1532  BP:  (!) 185/96 (!) 141/81 135/76  Pulse: 81 81 92 97  Resp: 17 15 18 19   Temp:    98.2 F (36.8 C)  TempSrc:    Oral  SpO2: 100% 100% 100% 100%  Weight:       Eyes: PERRL, lids and conjunctivae pale. ENMT: Mucous membranes are moist. Posterior pharynx clear of any exudate or lesions.Normal dentition.  Neck: normal, supple, no masses, no thyromegaly Respiratory: clear to auscultation bilaterally, no wheezing, no crackles. Normal respiratory effort. No accessory muscle use.  Cardiovascular: Regular rate and rhythm, tachycardic.  No murmurs / rubs / gallops. No extremity edema. 2+ pedal pulses. No carotid bruits.  Abdomen: no tenderness, no masses palpated. No hepatosplenomegaly. Bowel sounds positive.  Musculoskeletal: no clubbing / cyanosis. No joint deformity upper and lower extremities. Good ROM, no contractures. Normal muscle tone.  Skin: no rashes, lesions, ulcers. No induration Neurologic: CN 2-12 grossly intact. Sensation intact, DTR normal. Strength 5/5 in all 4.  Psychiatric: Normal judgment and insight. Alert and oriented x 3. Normal mood.     Labs on Admission: I have personally reviewed following labs and imaging studies  CBC: Recent Labs  Lab 11/09/24 1016 11/09/24 1222  WBC 8.5 7.9  NEUTROABS 5.3  --   HGB 4.9* 4.7*  HCT 16.1* 16.4*  MCV 69.7* 74.2*  PLT 366 359   Basic Metabolic Panel: Recent Labs  Lab 11/09/24 1016 11/09/24 1222  NA 138 137  K 2.6* 2.5*  CL 98 99  CO2 28 29  GLUCOSE 102* 107*  BUN 7 7  CREATININE 0.82 0.78  CALCIUM 8.8* 8.8*  MG  --  1.9   GFR: Estimated Creatinine Clearance: 79.5 mL/min (by C-G formula based on SCr of 0.78 mg/dL). Liver Function Tests: Recent Labs  Lab 11/09/24 1016  AST 19  ALT 8  ALKPHOS 55   BILITOT 0.4  PROT 7.3  ALBUMIN 3.9   No results for input(s): LIPASE, AMYLASE in the last 168 hours. No results for input(s): AMMONIA in the last 168 hours. Coagulation Profile: No results for input(s): INR, PROTIME in the last 168 hours. Cardiac Enzymes: No results for input(s): CKTOTAL, CKMB, CKMBINDEX, TROPONINI in the last 168 hours. BNP (last 3 results) No results for input(s): PROBNP in the last 8760 hours. HbA1C: No results for input(s): HGBA1C in the last 72 hours. CBG: No results for input(s): GLUCAP in the last 168 hours. Lipid Profile: No results for input(s): CHOL, HDL, LDLCALC, TRIG, CHOLHDL, LDLDIRECT in the last 72 hours. Thyroid  Function Tests: No results for input(s): TSH, T4TOTAL, FREET4, T3FREE, THYROIDAB in the last 72 hours. Anemia Panel: Recent Labs    11/09/24  1016  VITAMINB12 393  FOLATE 8.8  FERRITIN 11  TIBC 533*  IRON  <10*  RETICCTPCT 2.9   Urine analysis:    Component Value Date/Time   COLORURINE STRAW (A) 03/15/2024 0913   APPEARANCEUR CLEAR 03/15/2024 0913   LABSPEC 1.004 (L) 03/15/2024 0913   PHURINE 6.0 03/15/2024 0913   GLUCOSEU NEGATIVE 03/15/2024 0913   HGBUR NEGATIVE 03/15/2024 0913   BILIRUBINUR NEGATIVE 03/15/2024 0913   KETONESUR NEGATIVE 03/15/2024 0913   PROTEINUR NEGATIVE 03/15/2024 0913   NITRITE NEGATIVE 03/15/2024 0913   LEUKOCYTESUR LARGE (A) 03/15/2024 0913    Radiological Exams on Admission: No results found.  EKG: Independently reviewed.  Sinus rhythm.  No acute changes.  Assessment/Plan Principal Problem:   Acute on chronic anemia Active Problems:   Iron  deficiency anemia   Hypokalemia   Essential hypertension   H/O dysfunctional uterine bleeding     1.  Acute on chronic blood loss anemia secondary to uterine bleeding: Chronic menorrhagia.  Outpatient workup in progress.  GI and gynecology follow-up in progress. Agree with admission given severity of  symptoms. Units of PRBC transfusion now. Will write 1 unit of iron  transfusion tomorrow morning before discharge.  Recheck hemoglobin tomorrow morning. Continue oral iron  supplementation.  2.  Hypokalemia: Chronic issue.  Inadequate supplementation at home.  Patient already received 70 mEq of potassium.  Will give 80 mEq additional potassium tonight and recheck tomorrow morning.  3.  Hypertension: Blood pressure is stable.  Resume losartan .  4.  Uterine fibroid: As above.  She will need follow-up with gynecologist as scheduled.   DVT prophylaxis: SCDs Code Status: Full code Family Communication: None at the bedside Disposition Plan: Home when stabilized Consults called: None Admission status: Inpatient.  Telemetry.   Renato Applebaum MD [facility]        [1] No Known Allergies  "

## 2024-11-09 NOTE — Telephone Encounter (Signed)
 CRITICAL VALUE STICKER  CRITICAL VALUE: hgb 4.9   RECEIVER (on-site recipient of call):Damaris, CMA     DATE & TIME NOTIFIED: 11/09/2024 10:50 AM   MESSENGER (representative from lab):lab  MD NOTIFIED: Cassie, PA  TIME OF NOTIFICATION:10:50 AM   RESPONSE:  pt being sent to ED

## 2024-11-09 NOTE — Assessment & Plan Note (Addendum)
 Appears chronic intermittent issue.  Initial K of 2.5 - replacement started in the ED with IV K rider x 3. --40 mEq PO K-Cl x 1 dose  --Serial BMP's --Further replacement PRN for goal K ~4.0

## 2024-11-09 NOTE — Assessment & Plan Note (Signed)
 Uterine fibroids

## 2024-11-09 NOTE — Assessment & Plan Note (Signed)
 Initial BP elevated to 196/103, later 186/86 in the ED. --PRN IV hydralazine  --Resume home regimen of __

## 2024-11-10 DIAGNOSIS — D649 Anemia, unspecified: Secondary | ICD-10-CM | POA: Diagnosis not present

## 2024-11-10 LAB — BASIC METABOLIC PANEL WITH GFR
Anion gap: 8 (ref 5–15)
BUN: 9 mg/dL (ref 6–20)
CO2: 27 mmol/L (ref 22–32)
Calcium: 8.5 mg/dL — ABNORMAL LOW (ref 8.9–10.3)
Chloride: 102 mmol/L (ref 98–111)
Creatinine, Ser: 0.83 mg/dL (ref 0.44–1.00)
GFR, Estimated: >60 mL/min
Glucose, Bld: 95 mg/dL (ref 70–99)
Potassium: 3.5 mmol/L (ref 3.5–5.1)
Sodium: 136 mmol/L (ref 135–145)

## 2024-11-10 LAB — CBC
HCT: 21.8 % — ABNORMAL LOW (ref 36.0–46.0)
Hemoglobin: 7.2 g/dL — ABNORMAL LOW (ref 12.0–15.0)
MCH: 25.3 pg — ABNORMAL LOW (ref 26.0–34.0)
MCHC: 33 g/dL (ref 30.0–36.0)
MCV: 76.5 fL — ABNORMAL LOW (ref 80.0–100.0)
Platelets: 277 10*3/uL (ref 150–400)
RBC: 2.85 MIL/uL — ABNORMAL LOW (ref 3.87–5.11)
RDW: 20.1 % — ABNORMAL HIGH (ref 11.5–15.5)
WBC: 10.1 10*3/uL (ref 4.0–10.5)
nRBC: 0.5 % — ABNORMAL HIGH (ref 0.0–0.2)

## 2024-11-10 LAB — HEMOGLOBIN AND HEMATOCRIT, BLOOD
HCT: 21.4 % — ABNORMAL LOW (ref 36.0–46.0)
HCT: 25.6 % — ABNORMAL LOW (ref 36.0–46.0)
Hemoglobin: 7.2 g/dL — ABNORMAL LOW (ref 12.0–15.0)
Hemoglobin: 8.2 g/dL — ABNORMAL LOW (ref 12.0–15.0)

## 2024-11-10 LAB — PHOSPHORUS: Phosphorus: 2.7 mg/dL (ref 2.5–4.6)

## 2024-11-10 LAB — MAGNESIUM: Magnesium: 1.9 mg/dL (ref 1.7–2.4)

## 2024-11-10 LAB — PREPARE RBC (CROSSMATCH)

## 2024-11-10 MED ORDER — SODIUM CHLORIDE 0.9% IV SOLUTION: Freq: Once | INTRAVENOUS | Status: AC

## 2024-11-10 NOTE — Progress Notes (Signed)
 " PROGRESS NOTE    Crystal [facility]  FMW:[id number] DOB: 06-08-1976 DOA: 11/09/2024 PCP: Pcp, No     Brief Narrative:  Crystal [facility] is a 49 y.o. female with medical history significant of hypertension, chronic hypokalemia, abnormal uterine bleeding thought secondary to fibroid uterus and was scheduled to see gynecologist in March 3, receiving iron  transfusions at cancer center who was sent today and found to be symptomatic with progressive generalized weakness and lightheadedness.  At the clinic.  Hemoglobin was 4.7 and potassium was 2.5 so she was asked to go to the ER.   Patient stated that she has ongoing vaginal bleeding throughout the month, sometimes uses up to 6 pads a day. Always uses a pad since summertime. She recently had a CT scan that showed fibroid uterus and she was referred to gynecology at that is scheduled for March. She has been doing relatively well without any symptoms, however for last few days she has been more weak, short of breath getting out of the bed. Feels fatigued. Occasional lightheadedness. Patient has been compliant with taking oral iron  and potassium. Patient denies any GI symptoms including any hematemesis, hemoptysis, hematochezia or melena. She is scheduled to have colonoscopy with [facility] next week.   New events last 24 hours / Subjective: Patient feeling a little bit better this morning.  Continues to have vaginal bleeding.  Assessment & Plan:   Principal Problem:   Acute on chronic anemia Active Problems:   Iron  deficiency anemia   Hypokalemia   Essential hypertension   H/O dysfunctional uterine bleeding   Acute blood loss anemia, dysfunctional uterine bleeding, fibroid, chronic menorrhagia - Scheduled to see GYN in March - Has outpatient GI plan for colonoscopy next week - Received 2 unit packed red blood cells.  Hemoglobin 7.2 today, but has active bleeding still.  Give another unit of packed red blood cell today  Hypokalemia - Improved,  replace again today  Hypertension - Losartan    DVT prophylaxis:  SCDs Start: 11/09/24 1530  Code Status: Full code Family Communication: None Disposition Plan: Home Status is: Inpatient Remains inpatient appropriate because: Transfuse another unit today in light of continued active vaginal bleeding    Antimicrobials:  Anti-infectives (From admission, onward)    None        Objective: Vitals:   11/10/24 0411 11/10/24 1025 11/10/24 1029 11/10/24 1044  BP: 109/66 (!) 153/81 (!) 153/81 (!) 147/95  Pulse: 71 72 72 77  Resp: 16  16   Temp: 99.5 F (37.5 C) 98.6 F (37 C) 98.6 F (37 C) 98.2 F (36.8 C)  TempSrc: Oral Oral Oral Oral  SpO2: 99% 100%  100%  Weight:      Height:        Intake/Output Summary (Last 24 hours) at 11/10/2024 1206 Last data filed at 11/10/2024 0900 Gross per 24 hour  Intake 1559.33 ml  Output --  Net 1559.33 ml   Filed Weights   11/09/24 1213 11/09/24 1613  Weight: 69.4 kg 69.4 kg    Examination:  General exam: Appears calm and comfortable  Respiratory system: Clear to auscultation. Respiratory effort normal. No respiratory distress. No conversational dyspnea.  Cardiovascular system: S1 & S2 heard, RRR. No murmurs. No pedal edema. Gastrointestinal system: Abdomen is nondistended, soft and nontender. Normal bowel sounds heard. Central nervous system: Alert and oriented. No focal neurological deficits. Speech clear.  Extremities: Symmetric in appearance  Skin: No rashes, lesions or ulcers on exposed skin  Psychiatry: Judgement and  insight appear normal. Mood & affect appropriate.   Data Reviewed: I have personally reviewed following labs and imaging studies  CBC: Recent Labs  Lab 11/09/24 1016 11/09/24 1222 11/09/24 2327 11/10/24 0401  WBC 8.5 7.9  --  10.1  NEUTROABS 5.3  --   --   --   HGB 4.9* 4.7* 7.2* 7.2*  HCT 16.1* 16.4* 21.4* 21.8*  MCV 69.7* 74.2*  --  76.5*  PLT 366 359  --  277   Basic Metabolic Panel: Recent  Labs  Lab 11/09/24 1016 11/09/24 1222 11/10/24 0401  NA 138 137 136  K 2.6* 2.5* 3.5  CL 98 99 102  CO2 28 29 27   GLUCOSE 102* 107* 95  BUN 7 7 9   CREATININE 0.82 0.78 0.83  CALCIUM 8.8* 8.8* 8.5*  MG  --  1.9 1.9  PHOS  --   --  2.7   GFR: Estimated Creatinine Clearance: 76.6 mL/min (by C-G formula based on SCr of 0.83 mg/dL). Liver Function Tests: Recent Labs  Lab 11/09/24 1016  AST 19  ALT 8  ALKPHOS 55  BILITOT 0.4  PROT 7.3  ALBUMIN 3.9   No results for input(s): LIPASE, AMYLASE in the last 168 hours. No results for input(s): AMMONIA in the last 168 hours. Coagulation Profile: No results for input(s): INR, PROTIME in the last 168 hours. Cardiac Enzymes: No results for input(s): CKTOTAL, CKMB, CKMBINDEX, TROPONINI in the last 168 hours. BNP (last 3 results) No results for input(s): PROBNP in the last 8760 hours. HbA1C: No results for input(s): HGBA1C in the last 72 hours. CBG: No results for input(s): GLUCAP in the last 168 hours. Lipid Profile: No results for input(s): CHOL, HDL, LDLCALC, TRIG, CHOLHDL, LDLDIRECT in the last 72 hours. Thyroid  Function Tests: No results for input(s): TSH, T4TOTAL, FREET4, T3FREE, THYROIDAB in the last 72 hours. Anemia Panel: Recent Labs    11/09/24 1016  VITAMINB12 393  FOLATE 8.8  FERRITIN 11  TIBC 533*  IRON  <10*  RETICCTPCT 2.9   Sepsis Labs: No results for input(s): PROCALCITON, LATICACIDVEN in the last 168 hours.  No results found for this or any previous visit (from the past 240 hours).    Radiology Studies: No results found.    Scheduled Meds:  sodium chloride    Intravenous Once   ferrous sulfate   325 mg Oral Daily   losartan   25 mg Oral Daily   Continuous Infusions:   LOS: 1 day   Time spent: 25 minutes   Delon Hoe, DO [facility] 11/10/2024, 12:06 PM   Available via Epic secure chat 7am-7pm After these hours, please refer to  coverage provider listed on [email or web address]  "

## 2024-11-10 NOTE — Plan of Care (Signed)
   Problem: Clinical Measurements: Goal: Ability to maintain clinical measurements within normal limits will improve Outcome: Progressing Goal: Diagnostic test results will improve Outcome: Progressing   Problem: Safety: Goal: Ability to remain free from injury will improve Outcome: Progressing

## 2024-11-11 ENCOUNTER — Encounter (HOSPITAL_COMMUNITY): Payer: Self-pay | Admitting: Physician Assistant

## 2024-11-11 DIAGNOSIS — D649 Anemia, unspecified: Secondary | ICD-10-CM | POA: Diagnosis not present

## 2024-11-11 LAB — BASIC METABOLIC PANEL WITH GFR
Anion gap: 5 (ref 5–15)
BUN: 10 mg/dL (ref 6–20)
CO2: 27 mmol/L (ref 22–32)
Calcium: 8.6 mg/dL — ABNORMAL LOW (ref 8.9–10.3)
Chloride: 102 mmol/L (ref 98–111)
Creatinine, Ser: 0.99 mg/dL (ref 0.44–1.00)
GFR, Estimated: >60 mL/min
Glucose, Bld: 124 mg/dL — ABNORMAL HIGH (ref 70–99)
Potassium: 3.9 mmol/L (ref 3.5–5.1)
Sodium: 134 mmol/L — ABNORMAL LOW (ref 135–145)

## 2024-11-11 LAB — CBC
HCT: 26.3 % — ABNORMAL LOW (ref 36.0–46.0)
Hemoglobin: 8.7 g/dL — ABNORMAL LOW (ref 12.0–15.0)
MCH: 25.7 pg — ABNORMAL LOW (ref 26.0–34.0)
MCHC: 33.1 g/dL (ref 30.0–36.0)
MCV: 77.6 fL — ABNORMAL LOW (ref 80.0–100.0)
Platelets: 311 10*3/uL (ref 150–400)
RBC: 3.39 MIL/uL — ABNORMAL LOW (ref 3.87–5.11)
RDW: 20.4 % — ABNORMAL HIGH (ref 11.5–15.5)
WBC: 11.5 10*3/uL — ABNORMAL HIGH (ref 4.0–10.5)
nRBC: 0.6 % — ABNORMAL HIGH (ref 0.0–0.2)

## 2024-11-11 NOTE — Progress Notes (Signed)
 " PROGRESS NOTE    Crystal Webster  FMW:[id number] DOB: [birth year] DOA: 11/09/2024 PCP: Pcp, No     Brief Narrative:  Crystal Webster is a 49 y.o. female with medical history significant of hypertension, chronic hypokalemia, abnormal uterine bleeding thought secondary to fibroid uterus and was scheduled to see gynecologist in March 3, receiving iron  transfusions at cancer center who was sent today and found to be symptomatic with progressive generalized weakness and lightheadedness.  At the clinic.  Hemoglobin was 4.7 and potassium was 2.5 so she was asked to go to the ER.   Patient stated that she has ongoing vaginal bleeding throughout the month, sometimes uses up to 6 pads a day. Always uses a pad since summertime. She recently had a CT scan that showed fibroid uterus and she was referred to gynecology at that is scheduled for March. She has been doing relatively well without any symptoms, however for last few days she has been more weak, short of breath getting out of the bed. Feels fatigued. Occasional lightheadedness. Patient has been compliant with taking oral iron  and potassium. Patient denies any GI symptoms including any hematemesis, hemoptysis, hematochezia or melena. She is scheduled to have colonoscopy with [facility] next week.   She received total 3 unit pRBC transfusions.   New events last 24 hours / Subjective: Doing well, no complaints.   Assessment & Plan:   Principal Problem:   Acute on chronic anemia Active Problems:   Iron  deficiency anemia   Hypokalemia   Essential hypertension   H/O dysfunctional uterine bleeding   Acute blood loss anemia, dysfunctional uterine bleeding, fibroid, chronic menorrhagia - Scheduled to see GYN in March - Has outpatient GI plan for colonoscopy next week - S/p 3 unit pRBC transfusions - Hgb stable today 8.7   Hypokalemia - Resolved   Hypertension - Losartan    DVT prophylaxis:  SCDs Start: 11/09/24 1530  Code Status: Full  code Family Communication: None Disposition Plan: Home Status is: Inpatient Remains inpatient appropriate because: Medically ready for discharge but no way to get home due to icy road conditions. Plan for dc in AM     Antimicrobials:  Anti-infectives (From admission, onward)    None        Objective: Vitals:   11/10/24 1315 11/10/24 1321 11/10/24 2025 11/11/24 0406  BP: 138/83 138/83 (!) 164/97 (!) 157/90  Pulse: 70 69 60 66  Resp: 18 18 17 18   Temp: 99.6 F (37.6 C) 99.7 F (37.6 C) 98 F (36.7 C) 98 F (36.7 C)  TempSrc: Oral Oral    SpO2: 100% 99% 100% 100%  Weight:      Height:        Intake/Output Summary (Last 24 hours) at 11/11/2024 0952 Last data filed at 11/11/2024 0850 Gross per 24 hour  Intake 940.71 ml  Output --  Net 940.71 ml   Filed Weights   11/09/24 1213 11/09/24 1613  Weight: 69.4 kg 69.4 kg    Examination:  General exam: Appears calm and comfortable  Respiratory system: Respiratory effort normal. No respiratory distress. No conversational dyspnea.  Psychiatry: Judgement and insight appear normal. Mood & affect appropriate.   Data Reviewed: I have personally reviewed following labs and imaging studies  CBC: Recent Labs  Lab 11/09/24 1016 11/09/24 1222 11/09/24 2327 11/10/24 0401 11/10/24 1516 11/11/24 0404  WBC 8.5 7.9  --  10.1  --  11.5*  NEUTROABS 5.3  --   --   --   --   --  HGB 4.9* 4.7* 7.2* 7.2* 8.2* 8.7*  HCT 16.1* 16.4* 21.4* 21.8* 25.6* 26.3*  MCV 69.7* 74.2*  --  76.5*  --  77.6*  PLT 366 359  --  277  --  311   Basic Metabolic Panel: Recent Labs  Lab 11/09/24 1016 11/09/24 1222 11/10/24 0401 11/11/24 0404  NA 138 137 136 134*  K 2.6* 2.5* 3.5 3.9  CL 98 99 102 102  CO2 28 29 27 27   GLUCOSE 102* 107* 95 124*  BUN 7 7 9 10   CREATININE 0.82 0.78 0.83 0.99  CALCIUM 8.8* 8.8* 8.5* 8.6*  MG  --  1.9 1.9  --   PHOS  --   --  2.7  --    GFR: Estimated Creatinine Clearance: 64.2 mL/min (by C-G formula based on  SCr of 0.99 mg/dL). Liver Function Tests: Recent Labs  Lab 11/09/24 1016  AST 19  ALT 8  ALKPHOS 55  BILITOT 0.4  PROT 7.3  ALBUMIN 3.9   No results for input(s): LIPASE, AMYLASE in the last 168 hours. No results for input(s): AMMONIA in the last 168 hours. Coagulation Profile: No results for input(s): INR, PROTIME in the last 168 hours. Cardiac Enzymes: No results for input(s): CKTOTAL, CKMB, CKMBINDEX, TROPONINI in the last 168 hours. BNP (last 3 results) No results for input(s): PROBNP in the last 8760 hours. HbA1C: No results for input(s): HGBA1C in the last 72 hours. CBG: No results for input(s): GLUCAP in the last 168 hours. Lipid Profile: No results for input(s): CHOL, HDL, LDLCALC, TRIG, CHOLHDL, LDLDIRECT in the last 72 hours. Thyroid  Function Tests: No results for input(s): TSH, T4TOTAL, FREET4, T3FREE, THYROIDAB in the last 72 hours. Anemia Panel: Recent Labs    11/09/24 1016  VITAMINB12 393  FOLATE 8.8  FERRITIN 11  TIBC 533*  IRON  <10*  RETICCTPCT 2.9   Sepsis Labs: No results for input(s): PROCALCITON, LATICACIDVEN in the last 168 hours.  No results found for this or any previous visit (from the past 240 hours).    Radiology Studies: No results found.    Scheduled Meds:  ferrous sulfate   325 mg Oral Daily   losartan   25 mg Oral Daily   Continuous Infusions:   LOS: 2 days   Time spent: 15 minutes   Delon Hoe, DO [facility] 11/11/2024, 9:52 AM   Available via Epic secure chat 7am-7pm After these hours, please refer to coverage provider listed on [email or web address]  "

## 2024-11-11 NOTE — Plan of Care (Signed)
 Pt anticipates discharge tomorrow 1/26  Problem: Health Behavior/Discharge Planning: Goal: Ability to manage health-related needs will improve Outcome: Completed/Met   Problem: Clinical Measurements: Goal: Ability to maintain clinical measurements within normal limits will improve Outcome: Completed/Met   Problem: Clinical Measurements: Goal: Will remain free from infection Outcome: Completed/Met

## 2024-11-12 ENCOUNTER — Telehealth (HOSPITAL_COMMUNITY): Payer: Self-pay

## 2024-11-12 ENCOUNTER — Other Ambulatory Visit (HOSPITAL_COMMUNITY): Payer: Self-pay | Admitting: Physician Assistant

## 2024-11-12 DIAGNOSIS — D649 Anemia, unspecified: Secondary | ICD-10-CM | POA: Diagnosis not present

## 2024-11-12 LAB — BASIC METABOLIC PANEL WITH GFR
Anion gap: 8 (ref 5–15)
BUN: 10 mg/dL (ref 6–20)
CO2: 24 mmol/L (ref 22–32)
Calcium: 8.8 mg/dL — ABNORMAL LOW (ref 8.9–10.3)
Chloride: 105 mmol/L (ref 98–111)
Creatinine, Ser: 0.89 mg/dL (ref 0.44–1.00)
GFR, Estimated: >60 mL/min
Glucose, Bld: 91 mg/dL (ref 70–99)
Potassium: 4.1 mmol/L (ref 3.5–5.1)
Sodium: 137 mmol/L (ref 135–145)

## 2024-11-12 LAB — CBC
HCT: 28.2 % — ABNORMAL LOW (ref 36.0–46.0)
Hemoglobin: 8.8 g/dL — ABNORMAL LOW (ref 12.0–15.0)
MCH: 25.3 pg — ABNORMAL LOW (ref 26.0–34.0)
MCHC: 31.2 g/dL (ref 30.0–36.0)
MCV: 81 fL (ref 80.0–100.0)
Platelets: 352 10*3/uL (ref 150–400)
RBC: 3.48 MIL/uL — ABNORMAL LOW (ref 3.87–5.11)
RDW: 21.8 % — ABNORMAL HIGH (ref 11.5–15.5)
WBC: 13.4 10*3/uL — ABNORMAL HIGH (ref 4.0–10.5)
nRBC: 0.4 % — ABNORMAL HIGH (ref 0.0–0.2)

## 2024-11-12 LAB — TYPE AND SCREEN
ABO/RH(D): B POS
Antibody Screen: NEGATIVE
Unit division: 0
Unit division: 0
Unit division: 0

## 2024-11-12 LAB — METHYLMALONIC ACID, SERUM: Methylmalonic Acid, Quantitative: 336 nmol/L (ref 0–378)

## 2024-11-12 LAB — BPAM RBC
Blood Product Expiration Date: 202602172359
Blood Product Expiration Date: 202602172359
Blood Product Expiration Date: 202602172359
ISSUE DATE / TIME: 202601231557
ISSUE DATE / TIME: 202601231831
ISSUE DATE / TIME: 202601241023
Unit Type and Rh: 7300
Unit Type and Rh: 7300
Unit Type and Rh: 7300

## 2024-11-12 MED ORDER — FERROUS SULFATE 325 (65 FE) MG PO TABS: 325.0000 mg | ORAL_TABLET | Freq: Every day | ORAL | 3 refills | Status: AC

## 2024-11-12 NOTE — Progress Notes (Addendum)
 Discharge instructions given to  patient questions asked and answered. Patient called for ride home should be here in an hour.

## 2024-11-12 NOTE — Telephone Encounter (Signed)
 Auth Submission: NO AUTH NEEDED Site of care: Site of care: [facility] Payer: [employer] Medication & CPT/J Code(s) submitted: Monoferric (Ferrci derisomaltose) [phone] Diagnosis Code: D50.9 Route of submission (phone, fax, portal):  Phone # Fax # Auth type: Buy/Bill HB Units/visits requested: 1000mg  x 1 dose Reference number:  Approval from: 11/12/24 to 02/10/25

## 2024-11-12 NOTE — Discharge Instructions (Signed)
 Iron  Rich foods:  Red meat Prunes Spinach Liver Cream of Wheat  Moderate sources: Iron -fortified cereals (bran, etc) Almonds Beans/peas Pork Lamb Ham Scallops Turkey Peaches Peanuts  You were cared for by a hospitalist during your hospital stay. If you have any questions about your discharge medications or the care you received while you were in the hospital after you are discharged, you can call the unit and ask to speak with the hospitalist on call if the hospitalist that took care of you is not available. Once you are discharged, your primary care physician will handle any further medical issues. Please note that NO REFILLS for any discharge medications will be authorized once you are discharged, as it is imperative that you return to your primary care physician (or establish a relationship with a primary care physician if you do not have one) for your aftercare needs so that they can reassess your need for medications and monitor your lab values.

## 2024-11-12 NOTE — Discharge Summary (Signed)
 Physician Discharge Summary  Crystal Webster FMW:[id number] DOB: 10-13-1976 DOA: 11/09/2024  PCP: Pcp, No  Admit date: 11/09/2024 Discharge date: 11/12/2024  Admitted From: Home Disposition: Home  Recommendations for Outpatient Follow-up:  Follow up with PCP in 1 week Follow up with GYN as originally scheduled on 12/18/24. Follow-up with Dr. Charlanne of the GI team on 11/16/2024. Please obtain BMP/CBC in 1 week to follow-up on hypokalemia and ensure stability of hemoglobin respectively.  Home Health: None Equipment/Devices: None  Discharge Condition: Good CODE STATUS: Full  Diet recommendation: Heart Healthy  Brief/Interim Summary: From H&P: From Dr. MARLA Applebaum: Crystal Webster is a 49 y.o. female with medical history significant of hypertension, chronic hypokalemia, abnormal uterine bleeding thought secondary to fibroid uterus and was scheduled to see gynecologist in March 3, receiving iron  transfusions at cancer center who was sent today and found to be symptomatic with progressive generalized weakness and lightheadedness.  At the clinic.  Hemoglobin was 4.7 and potassium was 2.5 so she was asked to go to the ER.  Patient tells me that she has ongoing vaginal bleeding throughout the month, sometimes uses up to 6 pads a day.  Always uses a pad since summertime.  She recently had a CT scan that showed fibroid uterus and she was referred to gynecology at that is scheduled for March.  She has been doing relatively well without any symptoms, however for last few days she has been more weak, short of breath getting out of the bed.  Feels fatigued.  Occasional lightheadedness.  Patient has been compliant with taking oral iron  and potassium.  Patient denies any GI symptoms including any hematemesis, hemoptysis, hematochezia or melena.  She is scheduled to have colonoscopy with [facility] next week.  ED Course: Hemodynamically stable.  Hemoglobin 4.7.  Potassium 2.5.  Potassium replacement is started.  Sent  to the hospital for blood transfusion and stabilization due to significant symptoms. Patient is due to receive 2 units of blood transfusion, she has consented. Received 40 of potassium chloride  p.o. and 30 mEq of IV potassium chloride  already on my exam.  Magnesium  is adequate.  Interim: Patient's fatigue and lightheadedness steadily improved.  She received 3 units of packed red blood cells in total.  Hemoglobin was stable leading into her discharge.  No obvious blood in her urine, vaginal region, stool upon discharge.  Subjective on day of discharge: Reports feeling well and is ready to go.  She would have left yesterday were not for icy roads.  She has appropriate follow-up with gastroenterology and gynecology.  Discharge Diagnoses:  Principal Problem:   Acute on chronic anemia Active Problems:   Hypokalemia   Iron  deficiency anemia   Essential hypertension   H/O dysfunctional uterine bleeding   Acute blood loss anemia, dysfunctional uterine bleeding, fibroid, chronic menorrhagia - Scheduled to see GYN in March - Has outpatient GI plan for colonoscopy next week - S/p 3 unit pRBC transfusions - Hgb stable on discharge, to be rechecked on an outpatient basis - Ferrous sulfate  325 mg daily to take, every other day if stomach does not allow - A list of iron  rich foods was provided in her discharge paperwork   Hypokalemia - Resolved, monitor outpatient   Hypertension - Continue losartan  25 mg daily  Discharge Instructions  Discharge Instructions     Call MD for:  extreme fatigue   Complete by: As directed    Diet general   Complete by: As directed    Increase activity  slowly   Complete by: As directed       Allergies as of 11/12/2024   No Known Allergies      Medication List     STOP taking these medications    famotidine  40 MG tablet Commonly known as: Pepcid    Na Sulfate-K Sulfate-Mg Sulfate concentrate 17.5-3.13-1.6 GM/177ML Soln Commonly known as: SUPREP        TAKE these medications    ferrous sulfate  325 (65 FE) MG tablet Take 1 tablet (325 mg total) by mouth daily with breakfast. What changed:  medication strength when to take this   losartan  25 MG tablet Commonly known as: COZAAR  Take 2 tablets (50 mg total) by mouth daily. What changed: how much to take   Omron 3 Series BP Monitor Devi Use to check blood pressure as directed        Allergies[1]  Consultations: None   Procedures/Studies: None   Discharge Exam: Vitals:   11/11/24 2011 11/12/24 0426  BP: (!) 141/86 (!) 159/88  Pulse: 78 67  Resp: 17 15  Temp: 98.7 F (37.1 C) 98.5 F (36.9 C)  SpO2: 100% 100%    Exam General: Pt is alert, awake, not in acute distress Cardiovascular: RRR, S1/S2 +, no edema Respiratory: CTA bilaterally, no wheezing, no rhonchi, no respiratory distress, no conversational dyspnea  Abdominal: Soft, NT, ND, bowel sounds + Extremities: no edema, no cyanosis Psych: Normal mood and affect, stable judgement and insight     The results of significant diagnostics from this hospitalization (including imaging, microbiology, ancillary and laboratory) are listed below for reference.     Microbiology: N/A  Labs: Basic Metabolic Panel: Recent Labs  Lab 11/09/24 1016 11/09/24 1222 11/10/24 0401 11/11/24 0404 11/12/24 0332  NA 138 137 136 134* 137  K 2.6* 2.5* 3.5 3.9 4.1  CL 98 99 102 102 105  CO2 28 29 27 27 24   GLUCOSE 102* 107* 95 124* 91  BUN 7 7 9 10 10   CREATININE 0.82 0.78 0.83 0.99 0.89  CALCIUM 8.8* 8.8* 8.5* 8.6* 8.8*  MG  --  1.9 1.9  --   --   PHOS  --   --  2.7  --   --    Liver Function Tests: Recent Labs  Lab 11/09/24 1016  AST 19  ALT 8  ALKPHOS 55  BILITOT 0.4  PROT 7.3  ALBUMIN 3.9   CBC: Recent Labs  Lab 11/09/24 1016 11/09/24 1016 11/09/24 1222 11/09/24 2327 11/10/24 0401 11/10/24 1516 11/11/24 0404 11/12/24 0332  WBC 8.5  --  7.9  --  10.1  --  11.5* 13.4*  NEUTROABS 5.3  --    --   --   --   --   --   --   HGB 4.9*   < > 4.7* 7.2* 7.2* 8.2* 8.7* 8.8*  HCT 16.1*  --  16.4* 21.4* 21.8* 25.6* 26.3* 28.2*  MCV 69.7*  --  74.2*  --  76.5*  --  77.6* 81.0  PLT 366  --  359  --  277  --  311 352   < > = values in this interval not displayed.   Sepsis Labs Recent Labs  Lab 11/09/24 1222 11/10/24 0401 11/11/24 0404 11/12/24 0332  WBC 7.9 10.1 11.5* 13.4*   Patient was seen and examined on the day of discharge and was found to be in stable condition. Time coordinating discharge: 31 minutes including assessment and coordination of care, as well as examination  of the patient.   SIGNED:  Mabel Deward Pry, DO [facility] 11/12/2024, 4:27 PM      [1] No Known Allergies

## 2024-11-15 ENCOUNTER — Telehealth: Payer: Self-pay | Admitting: *Deleted

## 2024-11-15 DIAGNOSIS — D509 Iron deficiency anemia, unspecified: Secondary | ICD-10-CM

## 2024-11-15 DIAGNOSIS — E876 Hypokalemia: Secondary | ICD-10-CM

## 2024-11-15 NOTE — Telephone Encounter (Signed)
 Called pt to see if she could arrive 30 minutes early for her appointment tomorrow. She states she is not able to d/t arranging transportation via insurance/Medicaid. She informed RN that she was recently hospitalized for hypokalemia and symptomatic anemia on 11/09/24 and states she believes she was transfused 3 units of blood during that visit. Instructed pt that RN will have MD review recent hospitalization to see if she is stable to proceed with endo/colon scheduled for tomorrow. Will call pt back to make her aware of MD recommendations.

## 2024-11-15 NOTE — Telephone Encounter (Signed)
 After further discussion with MD and CRNA, pt will need to go for STAT CBC and BMET prior to arriving to 4th floor to obtain labs to evaluate Hgb and potasium levels. Order placed for labs. Called and discussed with pt to go to the basement first for labs to be drawn and then come to the 4th floor.  Also discussed care partner policy with pt as she had previously stated her transportation will be provided by her insurance. She states she is still working on obtaining a care partner to stay during her visit. Instructed pt to call back if she does not find someone, as she will need to be rescheduled per [facility] policy. Pt verbalized understanding of all information provided during this call.

## 2024-11-15 NOTE — Telephone Encounter (Signed)
 Hb 8.8, K 4.1 Should be ok to proceed with EGD/colon RG

## 2024-11-16 ENCOUNTER — Encounter: Admitting: Gastroenterology

## 2024-11-16 ENCOUNTER — Telehealth: Payer: Self-pay

## 2024-11-16 ENCOUNTER — Emergency Department (HOSPITAL_COMMUNITY)
Admission: EM | Admit: 2024-11-16 | Discharge: 2024-11-17 | Disposition: A | Attending: Emergency Medicine | Admitting: Emergency Medicine

## 2024-11-16 ENCOUNTER — Telehealth: Payer: Self-pay | Admitting: *Deleted

## 2024-11-16 ENCOUNTER — Other Ambulatory Visit: Payer: Self-pay

## 2024-11-16 DIAGNOSIS — D219 Benign neoplasm of connective and other soft tissue, unspecified: Secondary | ICD-10-CM

## 2024-11-16 DIAGNOSIS — D259 Leiomyoma of uterus, unspecified: Secondary | ICD-10-CM | POA: Diagnosis not present

## 2024-11-16 DIAGNOSIS — N939 Abnormal uterine and vaginal bleeding, unspecified: Secondary | ICD-10-CM | POA: Diagnosis present

## 2024-11-16 DIAGNOSIS — F17210 Nicotine dependence, cigarettes, uncomplicated: Secondary | ICD-10-CM | POA: Insufficient documentation

## 2024-11-16 LAB — COMPREHENSIVE METABOLIC PANEL WITH GFR
ALT: 7 U/L (ref 0–44)
AST: 19 U/L (ref 15–41)
Albumin: 3.4 g/dL — ABNORMAL LOW (ref 3.5–5.0)
Alkaline Phosphatase: 49 U/L (ref 38–126)
Anion gap: 9 (ref 5–15)
BUN: 10 mg/dL (ref 6–20)
CO2: 27 mmol/L (ref 22–32)
Calcium: 8.4 mg/dL — ABNORMAL LOW (ref 8.9–10.3)
Chloride: 102 mmol/L (ref 98–111)
Creatinine, Ser: 0.88 mg/dL (ref 0.44–1.00)
GFR, Estimated: >60 mL/min
Glucose, Bld: 90 mg/dL (ref 70–99)
Potassium: 3.1 mmol/L — ABNORMAL LOW (ref 3.5–5.1)
Sodium: 138 mmol/L (ref 135–145)
Total Bilirubin: 0.4 mg/dL (ref 0.0–1.2)
Total Protein: 6.2 g/dL — ABNORMAL LOW (ref 6.5–8.1)

## 2024-11-16 LAB — CBC
HCT: 25 % — ABNORMAL LOW (ref 36.0–46.0)
Hemoglobin: 7.5 g/dL — ABNORMAL LOW (ref 12.0–15.0)
MCH: 25.9 pg — ABNORMAL LOW (ref 26.0–34.0)
MCHC: 30 g/dL (ref 30.0–36.0)
MCV: 86.2 fL (ref 80.0–100.0)
Platelets: 302 10*3/uL (ref 150–400)
RBC: 2.9 MIL/uL — ABNORMAL LOW (ref 3.87–5.11)
RDW: 25.4 % — ABNORMAL HIGH (ref 11.5–15.5)
WBC: 9.5 10*3/uL (ref 4.0–10.5)
nRBC: 0 % (ref 0.0–0.2)

## 2024-11-16 LAB — I-STAT CHEM 8, ED
BUN: 9 mg/dL (ref 6–20)
Calcium, Ion: 1.07 mmol/L — ABNORMAL LOW (ref 1.15–1.40)
Chloride: 99 mmol/L (ref 98–111)
Creatinine, Ser: 1 mg/dL (ref 0.44–1.00)
Glucose, Bld: 90 mg/dL (ref 70–99)
HCT: 24 % — ABNORMAL LOW (ref 36.0–46.0)
Hemoglobin: 8.2 g/dL — ABNORMAL LOW (ref 12.0–15.0)
Potassium: 3 mmol/L — ABNORMAL LOW (ref 3.5–5.1)
Sodium: 139 mmol/L (ref 135–145)
TCO2: 27 mmol/L (ref 22–32)

## 2024-11-16 LAB — PROTIME-INR
INR: 1.1 (ref 0.8–1.2)
Prothrombin Time: 15.3 s — ABNORMAL HIGH (ref 11.4–15.2)

## 2024-11-16 LAB — HCG, SERUM, QUALITATIVE: Preg, Serum: NEGATIVE

## 2024-11-16 NOTE — Telephone Encounter (Signed)
 Phoned pt to see if she was able to find a care partner to come with her for procedure today.  She states that she was on hold to talk to a nurse because she is bleeding heavily again.  She reports that the bleeding is like it had been previously when her hemoglobin was low.  She also reports that she is passing clots.  Spoke with Dr. Charlanne and he wants her to go to [place] to be evaluated.  Pt verbalized understanding.  Will cancel procedure.

## 2024-11-16 NOTE — ED Triage Notes (Signed)
 Pt c/o lower abdominal pain that has been on going for weeks, also reports dizziness

## 2024-11-16 NOTE — ED Triage Notes (Signed)
 Patient POV for lower abdominal pain. Seen for same at [facility] long dx with fibroids and hemoglobin was low. Patient with bright red vaginal bleeding x 1 week with silver dollar clots. Changing pads every hour. No hx of same. Patient states she called gyne and could not get in until march. Patient was scheduled for colonoscopy today but did not due to bleeding. No nausea no vomiting -- patient endorses dizziness.   Patient endorses she takes iron , potassium, and blood pressure meds.

## 2024-11-16 NOTE — Telephone Encounter (Signed)
 Inbound call from patient stating she would like to speak specifically with Nurse Harlene and be advised on what to do since she still experiencing heavy bleeding and doesn't know if she should continue with procedure  Requesting a call back  Please advise  Thank you

## 2024-11-16 NOTE — Telephone Encounter (Signed)
 Spoke with patient regarding current symptoms. Patient reported continued heavy bleeding with clots and feeling slightly lightheaded. She stated she was scheduled for a colonoscopy today, which was canceled, and that GI advised her to be evaluated in the ER. Patient reported she is going to [facility] ER.  Follow-up appointment scheduled on 11/22/24 for labs at 245 PM and visit with Dr. Federico at 320 PM.  She voiced understanding.

## 2024-11-17 ENCOUNTER — Encounter (HOSPITAL_COMMUNITY): Payer: Self-pay

## 2024-11-17 LAB — PREPARE RBC (CROSSMATCH)

## 2024-11-17 MED ORDER — MEGESTROL ACETATE 40 MG PO TABS: 40.0000 mg | ORAL_TABLET | Freq: Two times a day (BID) | ORAL | 0 refills | Status: AC

## 2024-11-17 MED ORDER — MEGESTROL ACETATE 40 MG PO TABS
40.0000 mg | ORAL_TABLET | Freq: Every day | ORAL | Status: DC
  Administered 2024-11-17: 40 mg via ORAL
  Filled 2024-11-17: qty 1

## 2024-11-17 MED ORDER — SODIUM CHLORIDE 0.9 % IV BOLUS: 1000.0000 mL | Freq: Once | INTRAVENOUS | Status: DC

## 2024-11-17 MED ORDER — SODIUM CHLORIDE 0.9% IV SOLUTION: Freq: Once | INTRAVENOUS | Status: AC

## 2024-11-17 NOTE — ED Provider Notes (Signed)
 " [facility] Emergency Department Provider Note MRN:  [id number]  Arrival date & time: 11/17/24     Chief Complaint   Abdominal Pain   History of Present Illness   Crystal Webster is a 49 y.o. year-old female with a history of fibroids presenting to the ED with chief complaint of abdominal pain.  Continued lower abdominal cramping and vaginal bleeding.  Going through a pad every hour.  Recently admitted for the same issue with anemia requiring blood transfusion.  Explains that the problem is ongoing and she is feeling lightheaded again, she does not have follow-up until March.  Wondering when they are going to do surgery.  Review of Systems  A thorough review of systems was obtained and all systems are negative except as noted in the HPI and PMH.   Patient's Health History    Past Medical History:  Diagnosis Date   Ankle fracture, bimalleolar, closed 08/30/2014   Smoker    Uterine fibroid     Past Surgical History:  Procedure Laterality Date   ORIF ANKLE FRACTURE Right 09/06/2014   Procedure: OPEN REDUCTION INTERNAL FIXATION (ORIF) BIMALLEOLAR ANKLE FRACTURE;  Surgeon: Oneil JAYSON Herald, MD;  Location: [facility] OR;  Service: Orthopedics;  Laterality: Right;    Family History  Problem Relation Age of Onset   Breast cancer Maternal Aunt    Lung cancer Maternal Aunt    Stomach cancer Cousin 30       stage 4   Colon cancer Neg Hx    Colon polyps Neg Hx    Esophageal cancer Neg Hx    Rectal cancer Neg Hx     Social History   Socioeconomic History   Marital status: Single    Spouse name: Not on file   Number of children: Not on file   Years of education: Not on file   Highest education level: Not on file  Occupational History   Not on file  Tobacco Use   Smoking status: Every Day    Current packs/day: 1.00    Average packs/day: 1 pack/day for 20.0 years (20.0 ttl pk-yrs)    Types: Cigarettes   Smokeless tobacco: Never  Vaping Use   Vaping status:  Never Used  Substance and Sexual Activity   Alcohol use: No   Drug use: Not Currently    Comment: occassionally   Sexual activity: Yes    Birth control/protection: Condom  Other Topics Concern   Not on file  Social History Narrative   Not on file   Social Drivers of Health   Tobacco Use: High Risk (11/17/2024)   Patient History    Smoking Tobacco Use: Every Day    Smokeless Tobacco Use: Never    Passive Exposure: Not on file  Financial Resource Strain: Medium Risk (03/20/2024)   Overall Financial Resource Strain (CARDIA)    Difficulty of Paying Living Expenses: Somewhat hard  Food Insecurity: No Food Insecurity (11/09/2024)   Epic    Worried About Radiation Protection Practitioner of Food in the Last Year: Never true    Ran Out of Food in the Last Year: Never true  Transportation Needs: No Transportation Needs (11/09/2024)   Epic    Lack of Transportation (Medical): No    Lack of Transportation (Non-Medical): No  Physical Activity: Inactive (03/20/2024)   Exercise Vital Sign    Days of Exercise per Week: 0 days    Minutes of Exercise per Session: 0 min  Stress: No Stress Concern Present (03/20/2024)  [employer] of Occupational Health - Occupational Stress Questionnaire    Feeling of Stress : Not at all  Social Connections: Unknown (03/20/2024)   Social Connection and Isolation Panel    Frequency of Communication with Friends and Family: Twice a week    Frequency of Social Gatherings with Friends and Family: Three times a week    Attends Religious Services: 1 to 4 times per year    Active Member of Clubs or Organizations: No    Attends Banker Meetings: 1 to 4 times per year    Marital Status: Patient unable to answer  Intimate Partner Violence: Not At Risk (11/09/2024)   Epic    Fear of Current or Ex-Partner: No    Emotionally Abused: No    Physically Abused: No    Sexually Abused: No  Depression (PHQ2-9): Low Risk (03/20/2024)   Depression (PHQ2-9)    PHQ-2 Score: 0  Alcohol  Screen: Low Risk (03/20/2024)   Alcohol Screen    Last Alcohol Screening Score (AUDIT): 0  Housing: Low Risk (11/09/2024)   Epic    Unable to Pay for Housing in the Last Year: No    Number of Times Moved in the Last Year: 0    Homeless in the Last Year: No  Utilities: Not At Risk (11/09/2024)   Epic    Threatened with loss of utilities: No  Health Literacy: Adequate Health Literacy (03/20/2024)   B1300 Health Literacy    Frequency of need for help with medical instructions: Never     Physical Exam   Vitals:   11/17/24 0521 11/17/24 0600  BP: (!) 148/73 (!) 153/88  Pulse: 80 73  Resp: 18 18  Temp: 98.2 F (36.8 C)   SpO2: 100% 100%    CONSTITUTIONAL: Well-appearing, NAD NEURO/PSYCH:  Alert and oriented x 3, no focal deficits EYES:  eyes equal and reactive ENT/NECK:  no LAD, no JVD CARDIO: Regular rate, well-perfused, normal S1 and S2 PULM:  CTAB no wheezing or rhonchi GI/GU:  non-distended, non-tender MSK/SPINE:  No gross deformities, no edema SKIN:  no rash, atraumatic   *Additional and/or pertinent findings included in MDM below  Diagnostic and Interventional Summary    EKG Interpretation Date/Time:  Friday November 16 2024 17:37:35 EST Ventricular Rate:  88 PR Interval:  156 QRS Duration:  80 QT Interval:  396 QTC Calculation: 479 R Axis:   18  Text Interpretation: Sinus rhythm with occasional Premature ventricular complexes Nonspecific T wave abnormality Prolonged QT Abnormal ECG When compared with ECG of 09-Nov-2024 12:43, PREVIOUS ECG IS PRESENT Confirmed by Theadore Sharper [phone]) on 11/17/2024 6:02:25 AM       Labs Reviewed  COMPREHENSIVE METABOLIC PANEL WITH GFR - Abnormal; Notable for the following components:      Result Value   Potassium 3.1 (*)    Calcium 8.4 (*)    Total Protein 6.2 (*)    Albumin 3.4 (*)    All other components within normal limits  CBC - Abnormal; Notable for the following components:   RBC 2.90 (*)    Hemoglobin 7.5 (*)    HCT  25.0 (*)    MCH 25.9 (*)    RDW 25.4 (*)    All other components within normal limits  PROTIME-INR - Abnormal; Notable for the following components:   Prothrombin Time 15.3 (*)    All other components within normal limits  I-STAT CHEM 8, ED - Abnormal; Notable for the following components:   Potassium 3.0 (*)  Calcium, Ion 1.07 (*)    Hemoglobin 8.2 (*)    HCT 24.0 (*)    All other components within normal limits  HCG, SERUM, QUALITATIVE  URINALYSIS, ROUTINE W REFLEX MICROSCOPIC  TYPE AND SCREEN  PREPARE RBC (CROSSMATCH)    No orders to display    Medications  megestrol  (MEGACE ) tablet 40 mg (40 mg Oral Given 11/17/24 0447)  0.9 %  sodium chloride  infusion (Manually program via Guardrails IV Fluids) (0 mLs Intravenous Stopped 11/17/24 0553)     Procedures  /  Critical Care Procedures  ED Course and Medical Decision Making  Initial Impression and Ddx Suspect continued bleeding from uterine fibroids.  Per chart review her hemoglobin dropped to 4.3 and she received 3 units of blood while hospitalized.  Rechecking labs today to evaluate for anemia.  Reassuring vital signs at this time, patient nontoxic in no acute distress.  Past medical/surgical history that increases complexity of ED encounter: Fibroids  Interpretation of Diagnostics I personally reviewed the EKG and my interpretation is as follows: Sinus rhythm, PVC  No significant blood count or electrolyte disturbance.  Hemoglobin downtrending to 7.4  Patient Reassessment and Ultimate Disposition/Management     Patient received unit of blood here in the emergency department.  I discussed case with Dr. Armond of OB/GYN, plan is to provide patient with prescription for Megace  and she will follow-up in the office.  Discharged home.  Patient management required discussion with the following services or consulting groups:  OB/GYN  Complexity of Problems Addressed Acute illness or injury that poses threat of life of bodily  function  Additional Data Reviewed and Analyzed Further history obtained from: Recent discharge summary and Prior labs/imaging results  Additional Factors Impacting ED Encounter Risk Consideration of hospitalization  Ozell HERO. Theadore, MD [facility] Emergency Medicine [facility] [email or web address]  Final Clinical Impressions(s) / ED Diagnoses     ICD-10-CM   1. Vaginal bleeding  N93.9     2. Fibroids  D21.9       ED Discharge Orders          Ordered    megestrol  (MEGACE ) 40 MG tablet  2 times daily        11/17/24 0603             Discharge Instructions Discussed with and Provided to Patient:     Discharge Instructions      You were evaluated in the Emergency Department and after careful evaluation, we did not find any emergent condition requiring admission or further testing in the hospital.  Your exam/testing today is overall reassuring.  We gave you blood transfusion here in the emergency department.  We are starting you on a medication to slow down or stop your bleeding into you can see the OB/GYN doctors.  Call their office to see if your appointment can be moved up.  Please return to the Emergency Department if you experience any worsening of your condition.   Thank you for allowing us  to be a part of your care.       Theadore Ozell HERO, MD 11/17/24 [phone]  "

## 2024-11-17 NOTE — ED Notes (Signed)
 Pt left without being seen.

## 2024-11-17 NOTE — Discharge Instructions (Signed)
 You were evaluated in the Emergency Department and after careful evaluation, we did not find any emergent condition requiring admission or further testing in the hospital.  Your exam/testing today is overall reassuring.  We gave you blood transfusion here in the emergency department.  We are starting you on a medication to slow down or stop your bleeding into you can see the OB/GYN doctors.  Call their office to see if your appointment can be moved up.  Please return to the Emergency Department if you experience any worsening of your condition.   Thank you for allowing us  to be a part of your care.

## 2024-11-18 LAB — TYPE AND SCREEN
ABO/RH(D): B POS
Antibody Screen: NEGATIVE
Unit division: 0

## 2024-11-18 LAB — BPAM RBC
Blood Product Expiration Date: 202602272359
ISSUE DATE / TIME: 202601310358
Unit Type and Rh: 7300

## 2024-11-21 ENCOUNTER — Telehealth: Payer: Self-pay | Admitting: Hematology and Oncology

## 2024-11-21 NOTE — Telephone Encounter (Signed)
 Tried contacting pt to inform of appt time tmr

## 2024-11-22 ENCOUNTER — Telehealth: Payer: Self-pay | Admitting: Hematology and Oncology

## 2024-11-22 ENCOUNTER — Other Ambulatory Visit: Payer: Self-pay | Admitting: *Deleted

## 2024-11-22 ENCOUNTER — Inpatient Hospital Stay: Attending: Nurse Practitioner | Admitting: Hematology and Oncology

## 2024-11-22 ENCOUNTER — Inpatient Hospital Stay

## 2024-11-22 DIAGNOSIS — D508 Other iron deficiency anemias: Secondary | ICD-10-CM

## 2024-11-22 NOTE — Telephone Encounter (Signed)
 Called pt to confirm tmr scheduled appt

## 2024-11-22 NOTE — Telephone Encounter (Signed)
 Called pt to inform of the urgency to get labs done

## 2024-11-23 ENCOUNTER — Telehealth: Payer: Self-pay | Admitting: Hematology and Oncology

## 2024-11-23 ENCOUNTER — Inpatient Hospital Stay: Attending: Nurse Practitioner

## 2024-11-23 ENCOUNTER — Inpatient Hospital Stay (HOSPITAL_COMMUNITY): Admission: RE | Admit: 2024-11-23 | Source: Ambulatory Visit

## 2024-11-26 ENCOUNTER — Inpatient Hospital Stay: Attending: Nurse Practitioner

## 2024-11-30 ENCOUNTER — Encounter (HOSPITAL_COMMUNITY)

## 2024-12-18 ENCOUNTER — Encounter: Payer: Self-pay | Admitting: Advanced Practice Midwife
